# Patient Record
Sex: Female | Born: 1965 | Race: White | Hispanic: No | Marital: Married | Smoking: Never smoker
Health system: Southern US, Community
[De-identification: ages and names within clinical notes are randomized; demographics above are authoritative.]

## PROBLEM LIST (undated history)

## (undated) DIAGNOSIS — Z9221 Personal history of antineoplastic chemotherapy: Secondary | ICD-10-CM

## (undated) DIAGNOSIS — IMO0002 Reserved for concepts with insufficient information to code with codable children: Secondary | ICD-10-CM

## (undated) DIAGNOSIS — F419 Anxiety disorder, unspecified: Secondary | ICD-10-CM

## (undated) DIAGNOSIS — T8859XA Other complications of anesthesia, initial encounter: Secondary | ICD-10-CM

## (undated) DIAGNOSIS — M1990 Unspecified osteoarthritis, unspecified site: Secondary | ICD-10-CM

## (undated) DIAGNOSIS — R112 Nausea with vomiting, unspecified: Secondary | ICD-10-CM

## (undated) DIAGNOSIS — Z9889 Other specified postprocedural states: Secondary | ICD-10-CM

## (undated) DIAGNOSIS — R0683 Snoring: Secondary | ICD-10-CM

## (undated) DIAGNOSIS — K219 Gastro-esophageal reflux disease without esophagitis: Secondary | ICD-10-CM

## (undated) DIAGNOSIS — I10 Essential (primary) hypertension: Secondary | ICD-10-CM

## (undated) HISTORY — PX: LAPAROSCOPY: SHX197

## (undated) HISTORY — PX: NASAL SINUS SURGERY: SHX719

## (undated) HISTORY — PX: ABDOMINAL HYSTERECTOMY: SHX81

## (undated) HISTORY — DX: Unspecified osteoarthritis, unspecified site: M19.90

## (undated) HISTORY — DX: Gastro-esophageal reflux disease without esophagitis: K21.9

## (undated) HISTORY — PX: BLADDER SURGERY: SHX569

## (undated) HISTORY — DX: Anxiety disorder, unspecified: F41.9

---

## 1987-12-11 HISTORY — PX: DILATION AND CURETTAGE OF UTERUS: SHX78

## 2006-10-17 ENCOUNTER — Ambulatory Visit: Payer: Self-pay

## 2007-12-11 HISTORY — PX: TOTAL ABDOMINAL HYSTERECTOMY: SHX209

## 2007-12-11 HISTORY — PX: ABDOMINAL HYSTERECTOMY: SHX81

## 2007-12-11 HISTORY — PX: BLADDER SURGERY: SHX569

## 2008-02-19 ENCOUNTER — Ambulatory Visit: Payer: Self-pay | Admitting: Obstetrics and Gynecology

## 2008-03-08 ENCOUNTER — Inpatient Hospital Stay: Payer: Self-pay | Admitting: Obstetrics and Gynecology

## 2009-07-21 ENCOUNTER — Ambulatory Visit: Payer: Self-pay

## 2010-11-17 ENCOUNTER — Ambulatory Visit: Payer: Self-pay | Admitting: Internal Medicine

## 2011-11-09 ENCOUNTER — Ambulatory Visit: Payer: Self-pay

## 2011-11-20 ENCOUNTER — Ambulatory Visit: Payer: Self-pay

## 2012-06-03 ENCOUNTER — Ambulatory Visit: Payer: Self-pay | Admitting: General Practice

## 2012-08-19 ENCOUNTER — Emergency Department: Payer: Self-pay | Admitting: Unknown Physician Specialty

## 2013-01-22 ENCOUNTER — Emergency Department: Payer: Self-pay | Admitting: Emergency Medicine

## 2013-01-22 LAB — CBC
MCV: 88 fL (ref 80–100)
Platelet: 334 10*3/uL (ref 150–440)
RBC: 4.58 10*6/uL (ref 3.80–5.20)
RDW: 13.7 % (ref 11.5–14.5)
WBC: 11.3 10*3/uL — ABNORMAL HIGH (ref 3.6–11.0)

## 2013-08-26 LAB — HM PAP SMEAR: HM Pap smear: NEGATIVE

## 2013-11-27 ENCOUNTER — Ambulatory Visit: Payer: Self-pay | Admitting: Obstetrics and Gynecology

## 2015-06-02 ENCOUNTER — Ambulatory Visit
Admission: EM | Admit: 2015-06-02 | Discharge: 2015-06-02 | Disposition: A | Discharge status: Home / Self Care | Payer: No Typology Code available for payment source | Attending: Internal Medicine | Admitting: Internal Medicine

## 2015-06-02 ENCOUNTER — Ambulatory Visit: Payer: No Typology Code available for payment source

## 2015-06-02 DIAGNOSIS — R05 Cough: Secondary | ICD-10-CM | POA: Diagnosis present

## 2015-06-02 DIAGNOSIS — J209 Acute bronchitis, unspecified: Secondary | ICD-10-CM | POA: Insufficient documentation

## 2015-06-02 DIAGNOSIS — J0100 Acute maxillary sinusitis, unspecified: Secondary | ICD-10-CM | POA: Insufficient documentation

## 2015-06-02 DIAGNOSIS — H6593 Unspecified nonsuppurative otitis media, bilateral: Secondary | ICD-10-CM | POA: Insufficient documentation

## 2015-06-02 DIAGNOSIS — H6121 Impacted cerumen, right ear: Secondary | ICD-10-CM | POA: Diagnosis not present

## 2015-06-02 HISTORY — DX: Essential (primary) hypertension: I10

## 2015-06-02 HISTORY — DX: Reserved for concepts with insufficient information to code with codable children: IMO0002

## 2015-06-02 MED ORDER — FLUTICASONE PROPIONATE 50 MCG/ACT NA SUSP: 1.0000 | Freq: Two times a day (BID) | NASAL | Status: DC

## 2015-06-02 MED ORDER — SALINE SPRAY 0.65 % NA SOLN: 2.0000 | NASAL | Status: DC

## 2015-06-02 MED ORDER — OXYMETAZOLINE HCL 0.05 % NA SOLN: 1.0000 | Freq: Two times a day (BID) | NASAL | Status: DC

## 2015-06-02 MED ORDER — IPRATROPIUM-ALBUTEROL 0.5-2.5 (3) MG/3ML IN SOLN
3.0000 mL | Freq: Once | RESPIRATORY_TRACT | Status: AC
  Administered 2015-06-02: 3 mL via RESPIRATORY_TRACT

## 2015-06-02 MED ORDER — BENZONATATE 200 MG PO CAPS: 200.0000 mg | ORAL_CAPSULE | Freq: Three times a day (TID) | ORAL | Status: DC | PRN

## 2015-06-02 MED ORDER — AMOXICILLIN-POT CLAVULANATE 875-125 MG PO TABS: 1.0000 | ORAL_TABLET | Freq: Two times a day (BID) | ORAL | Status: DC

## 2015-06-02 MED ORDER — ALBUTEROL SULFATE HFA 108 (90 BASE) MCG/ACT IN AERS: 1.0000 | INHALATION_SPRAY | RESPIRATORY_TRACT | Status: DC | PRN

## 2015-06-02 NOTE — ED Provider Notes (Signed)
CSN: [id number]     Arrival date & time 06/02/15  0800 History   First MD Initiated Contact with Patient 06/02/15 0848     Chief Complaint  Patient presents with  . Cough   (Consider location/radiation/quality/duration/timing/severity/associated sxs/prior Treatment) HPI Comments: Caucasian female with worsening cough, pain with coughing/deep inspiration (chest), right ear pain, right sided pain/facial/sinus pressure.  Had [facility] prescribed azithromycin and prednisone but hand swelling and increased blood pressure/headache so stopped yesterday as BP 140/98 when nurse at work took it for her on  po taper; patient requesting tessalon pearles for cough and another antibiotic.  If able to cough anything up thick green.  Night sweats/chills.  Leaving tomorrow for vacation at the beach.  Normal blood pressure per patient 120/80.  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 female presenting with cough. The history is provided by the patient.  Cough Cough characteristics:  Productive, non-productive and hacking Sputum characteristics:  Green Severity:  Moderate Onset quality:  Gradual Duration:  6 days Timing:  Intermittent Progression:  Worsening Chronicity:  New Smoker: no   Context: upper respiratory infection and weather changes   Context: not animal exposure, not exposure to allergens, not occupational exposure and not sick contacts   Relieved by:  Nothing Worsened by:  Deep breathing Ineffective treatments:  Rest and fluids Associated symptoms: chest pain, chills, diaphoresis, ear fullness, ear pain, headaches, rhinorrhea and sinus congestion   Associated symptoms: no eye discharge, no fever, no myalgias, no rash, no shortness of breath, no sore throat, no weight loss and no wheezing   Chest pain:    Quality:  Aching   Severity:  Mild   Duration:  6 days   Timing:   Intermittent   Progression:  Unchanged   Chronicity:  New Ear pain:    Location:  Right   Severity:  Mild   Onset quality:  Sudden   Duration:  6 days   Timing:  Intermittent   Progression:  Unchanged   Chronicity:  New Headaches:    Severity:  Severe   Onset quality:  Sudden   Duration:  2 days   Timing:  Constant   Progression:  Worsening   Chronicity:  Recurrent Rhinorrhea:    Quality:  White   Severity:  Moderate   Duration:  2 days   Timing:  Intermittent   Progression:  Worsening Risk factors: recent infection   Risk factors: no chemical exposure and no recent travel     Past Medical History  Diagnosis Date  . Choriocarcinoma   . Hypertension    Past Surgical History  Procedure Laterality Date  . Nasal sinus surgery     Family History  Problem Relation Age of Onset  . Diabetes Mother   . Hypertension Mother   . Hypertension Father    History  Substance Use Topics  . Smoking status: Never Smoker   . Smokeless tobacco: Not on file  . Alcohol Use: No   OB History    No data available     Review of Systems  Constitutional: Positive for chills and diaphoresis. Negative for fever, weight loss, activity change, appetite change and fatigue.  HENT: Positive for congestion, ear pain, rhinorrhea and sinus pressure. Negative for dental problem, drooling, ear discharge, facial swelling, hearing loss, mouth sores, nosebleeds, postnasal drip, sneezing, sore throat, tinnitus, trouble swallowing and voice change.  Eyes: Negative for photophobia, pain, discharge, redness, itching and visual disturbance.  Respiratory: Positive for cough. Negative for choking, shortness of breath, wheezing and stridor.   Cardiovascular: Positive for chest pain. Negative for palpitations and leg swelling.  Gastrointestinal: Negative for nausea, vomiting, abdominal pain, diarrhea, constipation, blood in stool, abdominal distention, anal bleeding and rectal pain.  Endocrine: Negative for  cold intolerance and heat intolerance.  Genitourinary: Negative for hematuria, enuresis and difficulty urinating.  Musculoskeletal: Negative for myalgias, back pain, joint swelling, arthralgias, gait problem, neck pain and neck stiffness.  Skin: Negative for color change, pallor, rash and wound.  Allergic/Immunologic: Positive for environmental allergies. Negative for food allergies.  Neurological: Positive for headaches. Negative for dizziness, tremors, seizures, syncope, facial asymmetry, speech difficulty, weakness, light-headedness and numbness.  Hematological: Negative for adenopathy. Does not bruise/bleed easily.  Psychiatric/Behavioral: Negative for behavioral problems, confusion, sleep disturbance and agitation.    Allergies  Codeine sulfate and Erythromycin  Home Medications   Prior to Admission medications   Medication Sig Start Date End Date Taking? Authorizing Provider  cetirizine (ZYRTEC) 10 MG tablet Take 10 mg by mouth daily.   Yes Historical Provider, MD  albuterol (PROVENTIL HFA;VENTOLIN HFA) 108 (90 BASE) MCG/ACT inhaler Inhale 1-2 puffs into the lungs every 4 (four) hours as needed for wheezing or shortness of breath. 06/02/15   Barbaraann Barthel, NP  amoxicillin-clavulanate (AUGMENTIN) 875-125 MG per tablet Take 1 tablet by mouth every 12 (twelve) hours. 06/02/15   Barbaraann Barthel, NP  benzonatate (TESSALON) 200 MG capsule Take 1 capsule (200 mg total) by mouth 3 (three) times daily as needed for cough. 06/02/15   Barbaraann Barthel, NP  fluticasone (FLONASE) 50 MCG/ACT nasal spray Place 1 spray into both nostrils 2 (two) times daily. 06/02/15   Barbaraann Barthel, NP  oxymetazoline (AFRIN NASAL SPRAY) 0.05 % nasal spray Place 1 spray into both nostrils 2 (two) times daily. 3 days maximum use 06/02/15   Barbaraann Barthel, NP  sodium chloride (OCEAN) 0.65 % SOLN nasal spray Place 2 sprays into both nostrils every 2 (two) hours while awake. 06/02/15   Jarold Song Kyiah Canepa, NP   BP  136/83 mmHg  Pulse 88  Temp(Src) 98 F (36.7 C) (Oral)  Resp 16  Ht 5\' 8"  (1.727 m)  Wt 180 lb (81.647 kg)  BMI 27.38 kg/m2  SpO2 100% Physical Exam  Constitutional: She is oriented to person, place, and time. Vital signs are normal. She appears well-developed and well-nourished. No distress.  HENT:  Head: Normocephalic and atraumatic.  Right Ear: External ear normal.  Left Ear: External ear normal.  Nose: Mucosal edema and rhinorrhea present. No nose lacerations, sinus tenderness, nasal deformity, septal deviation or nasal septal hematoma. No epistaxis.  No foreign bodies. Right sinus exhibits maxillary sinus tenderness. Right sinus exhibits no frontal sinus tenderness. Left sinus exhibits maxillary sinus tenderness. Left sinus exhibits no frontal sinus tenderness.  Mouth/Throat: Uvula is midline and mucous membranes are normal. She does not have dentures. No oral lesions. No trismus in the jaw. Normal dentition. No dental abscesses, uvula swelling, lacerations or dental caries. Posterior oropharyngeal edema and posterior oropharyngeal erythema present. No oropharyngeal exudate or tonsillar abscesses.  Right TM occluded by cerumen adjacent to TM brown; left TM with air fluid level slight opacity; bilateral TMs with edema/erythema clear/white discharge  Eyes: Conjunctivae, EOM and lids are normal. Pupils are equal, round, and reactive to light. Right eye exhibits no discharge. No scleral icterus.  Neck: Trachea  normal and normal range of motion. Neck supple. No tracheal deviation present. No thyromegaly present.  Cardiovascular: Normal rate, regular rhythm, normal heart sounds and intact distal pulses.  Exam reveals no gallop and no friction rub.   No murmur heard. Pulmonary/Chest: Effort normal and breath sounds normal. No accessory muscle usage or stridor. No tachypnea and no bradypnea. No respiratory distress. She has no decreased breath sounds. She has no wheezes. She has no rhonchi. She  has no rales. She exhibits no tenderness.  Abdominal: Soft. Bowel sounds are normal. She exhibits no distension and no mass. There is no tenderness. There is no rebound and no guarding.  Musculoskeletal: Normal range of motion. She exhibits edema. She exhibits no tenderness.  Lymphadenopathy:    She has no cervical adenopathy.  Neurological: She is alert and oriented to person, place, and time. No cranial nerve deficit. Coordination normal.  Skin: Skin is warm, dry and intact. No rash noted. She is not diaphoretic. No erythema. No pallor.  Psychiatric: She has a normal mood and affect. Her speech is normal and behavior is normal. Judgment and thought content normal. Cognition and memory are normal.  Nursing note and vitals reviewed.   ED Course  Procedures (including critical care time) Labs Review Labs Reviewed - No data to display  Imaging Review Dg Chest 2 View  06/02/2015   CLINICAL DATA:  Six day history of cough. Shortness of breath with exertion. Chest tightness.  EXAM: CHEST  2 VIEW  COMPARISON:  None.  FINDINGS: Lungs are clear. Heart size and pulmonary vascularity are normal. No pneumothorax. No adenopathy. No bone lesions.  IMPRESSION: No edema or consolidation.   Electronically Signed   By: Bretta Bang III M.D.   On: 06/02/2015 09:15   Rechecked right TM after ear irrigation cerumen completely cleared/removed.  Air fluid level slight opacity TM intact right TM.  Bilateral BS improved airflow.  Patient reported she feels less chest congestion.  Rx for albuterol MDI 2 puffs po q4-6h prn wheezing/protracted coughing.  Discussed may continue mucinex in am but did not recommend pm use as will increase cough during hours of sleep.  Tessalon pearles  po TID prn cough or robitussin/delsym/nyquil OTC (choose one do not take all at same time)  Patient verbalized understanding of information/instructions, agreed with plan of care and had no further questions at this time.  MDM    1. Otitis media with effusion, bilateral   2. Cerumen impaction, right   3. Acute bronchitis, unspecified organism   4. Acute maxillary sinusitis, recurrence not specified   Supportive treatment.   No evidence of invasive bacterial infection, non toxic and well hydrated.  This is most likely self limiting viral infection.  I do not see where any further testing or imaging is necessary at this time.   I will suggest supportive care, rest, good hygiene and encourage the patient to take adequate fluids.  The patient is to return to clinic or EMERGENCY ROOM if symptoms worsen or change significantly e.g. ear pain, fever, purulent discharge from ears or bleeding.  Exitcare handout on otitis media with effusion given to patient.  Patient verbalized agreement and understanding of treatment plan.    Cerumen irrigation performed and complete clearing of earwax obtained by nurse.  Patient reported slight discomfort external ear canal after procedure, minor redness noted external canal.  Right TM with air fluid level slight opacity.  bilaterally TMs intact without erythema.  Patient reported sounds are louder now.  Discussed purpose of earwax with patient.  Avoid cotton applicator (Q-tip) use in ears.  Patient verbalized understanding, agreed with plan of care and had no further questions at this time.    Bronchitis simple, community acquired, may have started as viral (probably respiratory syncytial, parainfluenza, influenza, or adenovirus), but now evidence of acute purulent bronchitis with resultant bronchial edema and mucus formation.  Viruses are the most common cause of bronchial inflammation in otherwise healthy adults with acute bronchitis.  The appearance of sputum is not predictive of whether a bacterial infection is present.  Purulent sputum is most often caused by viral infections.  There are a small portion of those caused by non-viral agents being Mycoplamsa pneumonia.  Microscopic examination or C&S  of sputum in the healthy adult with acute bronchitis is generally not helpful (usually negative or normal respiratory flora) other considerations being cough from upper respiratory tract infections, sinusitis or allergic syndromes (mild asthma or viral pneumonia).  Differential Diagnosis:  reactive airway disease (asthma, allergic aspergillosis (eosinophilia), chronic bronchitis, respiratory infection (Sinusitis, Common cold, pneumonia), congestive heart failure, reflux esophagitis, bronchogenic tumor, aspiration syndromes and/or exposure irritants/tobacco smoke.  In this case, there is no evidence of any invasive bacterial illness.  Most likely viral etiology so will hold on antibiotic treatment.  Advise supportive care with rest, encourage fluids, good hygiene and watch for any worsening symptoms.  If they were to develop:  come back to the office or go to the emergency room if after hours. Without high fever, severe dyspnea, lack of physical findings or other risk factors, I will hold on a chest radiograph and CBC at this time. I discussed that approximately 50% of patients with acute bronchitis have a cough that lasts up to three weeks, and 25% for over a month.  Tylenol, one to two tablets every four hours as needed for fever or myalgias.   No aspirin.  Patient instructed to follow up in one week or sooner if symptoms worsen. Patient verbalized agreement and understanding of treatment plan.  P2:  hand washing and cover cough  No evidence of systemic bacterial infection, non toxic and well hydrated.  Will trial flonase or nasocort 1 spray each nostril BID as did not tolerate po prednisone earlier this month and add afrin 1 spray BID each nostril x 3 days and nasal saline 2 sprays each nostril q2h while awake/prn congestion.  Patient given paper augmentin Rx in case worsening sinusitis during vacation to [facility] beach area e.g. Fever greater than 100.47F, purulent nasal discharge.  I do not see where any further  testing or imaging is necessary at this time.   I will suggest supportive care, rest, good hygiene and encourage the patient to take adequate fluids.  The patient is to return to clinic or EMERGENCY ROOM if symptoms worsen or change significantly.  Exitcare handout on sinusitis given to patient.  Patient verbalized agreement and understanding of treatment plan and had no further questions at this time.   P2:  Hand washing and cover cough   Barbaraann Barthel, NP 06/02/15 1446

## 2015-06-02 NOTE — Discharge Instructions (Signed)
Acute Bronchitis Bronchitis is inflammation of the airways that extend from the windpipe into the lungs (bronchi). The inflammation often causes mucus to develop. This leads to a cough, which is the most common symptom of bronchitis.  In acute bronchitis, the condition usually develops suddenly and goes away over time, usually in a couple weeks. Smoking, allergies, and asthma can make bronchitis worse. Repeated episodes of bronchitis may cause further lung problems.  CAUSES Acute bronchitis is most often caused by the same virus that causes a cold. The virus can spread from person to person (contagious) through coughing, sneezing, and touching contaminated objects. SIGNS AND SYMPTOMS   Cough.   Fever.   Coughing up mucus.   Body aches.   Chest congestion.   Chills.   Shortness of breath.   Sore throat.  DIAGNOSIS  Acute bronchitis is usually diagnosed through a physical exam. Your health care provider will also ask you questions about your medical history. Tests, such as chest X-rays, are sometimes done to rule out other conditions.  TREATMENT  Acute bronchitis usually goes away in a couple weeks. Oftentimes, no medical treatment is necessary. Medicines are sometimes given for relief of fever or cough. Antibiotic medicines are usually not needed but may be prescribed in certain situations. In some cases, an inhaler may be recommended to help reduce shortness of breath and control the cough. A cool mist vaporizer may also be used to help thin bronchial secretions and make it easier to clear the chest.  HOME CARE INSTRUCTIONS  Get plenty of rest.   Drink enough fluids to keep your urine clear or pale yellow (unless you have a medical condition that requires fluid restriction). Increasing fluids may help thin your respiratory secretions (sputum) and reduce chest congestion, and it will prevent dehydration.   Take medicines only as directed by your health care provider.  If  you were prescribed an antibiotic medicine, finish it all even if you start to feel better.  Avoid smoking and secondhand smoke. Exposure to cigarette smoke or irritating chemicals will make bronchitis worse. If you are a smoker, consider using nicotine gum or skin patches to help control withdrawal symptoms. Quitting smoking will help your lungs heal faster.   Reduce the chances of another bout of acute bronchitis by washing your hands frequently, avoiding people with cold symptoms, and trying not to touch your hands to your mouth, nose, or eyes.   Keep all follow-up visits as directed by your health care provider.  SEEK MEDICAL CARE IF: Your symptoms do not improve after 1 week of treatment.  SEEK IMMEDIATE MEDICAL CARE IF:  You develop an increased fever or chills.   You have chest pain.   You have severe shortness of breath.  You have bloody sputum.   You develop dehydration.  You faint or repeatedly feel like you are going to pass out.  You develop repeated vomiting.  You develop a severe headache. MAKE SURE YOU:   Understand these instructions.  Will watch your condition.  Will get help right away if you are not doing well or get worse. Document Released: 01/03/2005 Document Revised: 04/12/2014 Document Reviewed: 05/19/2013 [facility] Patient Information 2015 [place], [place]. This information is not intended to replace advice given to you by your health care provider. Make sure you discuss any questions you have with your health care provider.  Cerumen Impaction A cerumen impaction is when the wax in your ear forms a plug. This plug usually causes reduced hearing. Sometimes it  also causes an earache or dizziness. Removing a cerumen impaction can be difficult and painful. The wax sticks to the ear canal. The canal is sensitive and bleeds easily. If you try to remove a heavy wax buildup with a cotton tipped swab, you may push it in further. Irrigation with water,  suction, and small ear curettes may be used to clear out the wax. If the impaction is fixed to the skin in the ear canal, ear drops may be needed for a few days to loosen the wax. People who build up a lot of wax frequently can use ear wax removal products available in your local drugstore. SEEK MEDICAL CARE IF:  You develop an earache, increased hearing loss, or marked dizziness. Document Released: 01/03/2005 Document Revised: 02/18/2012 Document Reviewed: 02/23/2010 [facility] Patient Information 2015 [place], [place]. This information is not intended to replace advice given to you by your health care provider. Make sure you discuss any questions you have with your health care provider. Sinusitis Sinusitis is redness, soreness, and inflammation of the paranasal sinuses. Paranasal sinuses are air pockets within the bones of your face (beneath the eyes, the middle of the forehead, or above the eyes). In healthy paranasal sinuses, mucus is able to drain out, and air is able to circulate through them by way of your nose. However, when your paranasal sinuses are inflamed, mucus and air can become trapped. This can allow bacteria and other germs to grow and cause infection. Sinusitis can develop quickly and last only a short time (acute) or continue over a long period (chronic). Sinusitis that lasts for more than 12 weeks is considered chronic.  CAUSES  Causes of sinusitis include:  Allergies.  Structural abnormalities, such as displacement of the cartilage that separates your nostrils (deviated septum), which can decrease the air flow through your nose and sinuses and affect sinus drainage.  Functional abnormalities, such as when the small hairs (cilia) that line your sinuses and help remove mucus do not work properly or are not present. SIGNS AND SYMPTOMS  Symptoms of acute and chronic sinusitis are the same. The primary symptoms are pain and pressure around the affected sinuses. Other symptoms  include:  Upper toothache.  Earache.  Headache.  Bad breath.  Decreased sense of smell and taste.  A cough, which worsens when you are lying flat.  Fatigue.  Fever.  Thick drainage from your nose, which often is green and may contain pus (purulent).  Swelling and warmth over the affected sinuses. DIAGNOSIS  Your health care provider will perform a physical exam. During the exam, your health care provider may:  Look in your nose for signs of abnormal growths in your nostrils (nasal polyps).  Tap over the affected sinus to check for signs of infection.  View the inside of your sinuses (endoscopy) using an imaging device that has a light attached (endoscope). If your health care provider suspects that you have chronic sinusitis, one or more of the following tests may be recommended:  Allergy tests.  Nasal culture. A sample of mucus is taken from your nose, sent to a lab, and screened for bacteria.  Nasal cytology. A sample of mucus is taken from your nose and examined by your health care provider to determine if your sinusitis is related to an allergy. TREATMENT  Most cases of acute sinusitis are related to a viral infection and will resolve on their own within 10 days. Sometimes medicines are prescribed to help relieve symptoms (pain medicine, decongestants, nasal steroid  sprays, or saline sprays).  However, for sinusitis related to a bacterial infection, your health care provider will prescribe antibiotic medicines. These are medicines that will help kill the bacteria causing the infection.  Rarely, sinusitis is caused by a fungal infection. In theses cases, your health care provider will prescribe antifungal medicine. For some cases of chronic sinusitis, surgery is needed. Generally, these are cases in which sinusitis recurs more than 3 times per year, despite other treatments. HOME CARE INSTRUCTIONS   Drink plenty of water. Water helps thin the mucus so your sinuses can  drain more easily.  Use a humidifier.  Inhale steam 3 to 4 times a day (for example, sit in the bathroom with the shower running).  Apply a warm, moist washcloth to your face 3 to 4 times a day, or as directed by your health care provider.  Use saline nasal sprays to help moisten and clean your sinuses.  Take medicines only as directed by your health care provider.  If you were prescribed either an antibiotic or antifungal medicine, finish it all even if you start to feel better. SEEK IMMEDIATE MEDICAL CARE IF:  You have increasing pain or severe headaches.  You have nausea, vomiting, or drowsiness.  You have swelling around your face.  You have vision problems.  You have a stiff neck.  You have difficulty breathing. MAKE SURE YOU:   Understand these instructions.  Will watch your condition.  Will get help right away if you are not doing well or get worse. Document Released: 11/26/2005 Document Revised: 04/12/2014 Document Reviewed: 12/11/2011 [facility] Patient Information 2015 [place], [place]. This information is not intended to replace advice given to you by your health care provider. Make sure you discuss any questions you have with your health care provider. Otitis Media With Effusion Otitis media with effusion is the presence of fluid in the middle ear. This is a common problem in children, which often follows ear infections. It may be present for weeks or longer after the infection. Unlike an acute ear infection, otitis media with effusion refers only to fluid behind the ear drum and not infection. Children with repeated ear and sinus infections and allergy problems are the most likely to get otitis media with effusion. CAUSES  The most frequent cause of the fluid buildup is dysfunction of the eustachian tubes. These are the tubes that drain fluid in the ears to the back of the nose (nasopharynx). SYMPTOMS   The main symptom of this condition is hearing loss. As a  result, you or your child may:  Listen to the TV at a loud volume.  Not respond to questions.  Ask "what" often when spoken to.  Mistake or confuse one sound or word for another.  There may be a sensation of fullness or pressure but usually not pain. DIAGNOSIS   Your health care provider will diagnose this condition by examining you or your child's ears.  Your health care provider may test the pressure in you or your child's ear with a tympanometer.  A hearing test may be conducted if the problem persists. TREATMENT   Treatment depends on the duration and the effects of the effusion.  Antibiotics, decongestants, nose drops, and cortisone-type drugs (tablets or nasal spray) may not be helpful.  Children with persistent ear effusions may have delayed language or behavioral problems. Children at risk for developmental delays in hearing, learning, and speech may require referral to a specialist earlier than children not at risk.  You  or your child's health care provider may suggest a referral to an ear, nose, and throat surgeon for treatment. The following may help restore normal hearing:  Drainage of fluid.  Placement of ear tubes (tympanostomy tubes).  Removal of adenoids (adenoidectomy). HOME CARE INSTRUCTIONS   Avoid secondhand smoke.  Infants who are breastfed are less likely to have this condition.  Avoid feeding infants while they are lying flat.  Avoid known environmental allergens.  Avoid people who are sick. SEEK MEDICAL CARE IF:   Hearing is not better in 3 months.  Hearing is worse.  Ear pain.  Drainage from the ear.  Dizziness. MAKE SURE YOU:   Understand these instructions.  Will watch your condition.  Will get help right away if you are not doing well or get worse. Document Released: 01/03/2005 Document Revised: 04/12/2014 Document Reviewed: 06/23/2013 [facility] Patient Information 2015 [place], [place]. This information is not intended to  replace advice given to you by your health care provider. Make sure you discuss any questions you have with your health care provider.

## 2015-06-02 NOTE — ED Notes (Signed)
Started Saturday with cough. Saw Greig Right PA and given Z-Pack. "Hasn't helped". States causing B/P to be elevated. Also was started on Prednisone but stopped.

## 2015-06-06 ENCOUNTER — Encounter: Payer: Self-pay | Admitting: *Deleted

## 2015-06-08 ENCOUNTER — Ambulatory Visit (INDEPENDENT_AMBULATORY_CARE_PROVIDER_SITE_OTHER): Payer: PRIVATE HEALTH INSURANCE | Admitting: Obstetrics and Gynecology

## 2015-06-08 ENCOUNTER — Encounter: Payer: Self-pay | Admitting: Obstetrics and Gynecology

## 2015-06-08 VITALS — BP 143/87 | HR 91 | Ht 68.0 in | Wt 183.7 lb

## 2015-06-08 DIAGNOSIS — IMO0001 Reserved for inherently not codable concepts without codable children: Secondary | ICD-10-CM

## 2015-06-08 DIAGNOSIS — R030 Elevated blood-pressure reading, without diagnosis of hypertension: Secondary | ICD-10-CM

## 2015-06-08 DIAGNOSIS — F419 Anxiety disorder, unspecified: Secondary | ICD-10-CM | POA: Diagnosis not present

## 2015-06-08 MED ORDER — ALPRAZOLAM 0.5 MG PO TABS: 0.5000 mg | ORAL_TABLET | Freq: Three times a day (TID) | ORAL | Status: DC | PRN

## 2015-06-08 MED ORDER — MONTELUKAST SODIUM 10 MG PO TABS: 10.0000 mg | ORAL_TABLET | Freq: Every day | ORAL | Status: DC

## 2015-06-08 MED ORDER — HYDROCHLOROTHIAZIDE 25 MG PO TABS: 25.0000 mg | ORAL_TABLET | Freq: Every day | ORAL | Status: DC

## 2015-06-08 NOTE — Progress Notes (Signed)
Patient ID: JAMIE HAFFORD, female   DOB: 02-Oct-1966, 49 y.o.   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  She has the following symptoms: fatigue, feelings of losing control, irritable, racing thoughts. Onset of symptoms was approximately 4 weeks ago, gradually worsening since that time. She denies current suicidal and homicidal ideation. Family history significant for depression.Possible organic causes contributing are: none. Risk factors: previous episode of depression Previous treatment includes Selective Seratonin Reuptake Inhibitor NA and none.  She complains of the following side effects from the treatment: none. Subjective:  MALEIGH BAGOT is a 49 y.o. female with hypertension. Current Outpatient Prescriptions  Medication Sig Dispense Refill  . albuterol (PROVENTIL HFA;VENTOLIN HFA) 108 (90 BASE) MCG/ACT inhaler Inhale 1-2 puffs into the lungs every 4 (four) hours as needed for wheezing or shortness of breath. 1 Inhaler 0  . benzonatate (TESSALON) 200 MG capsule Take 1 capsule (200 mg total) by mouth 3 (three) times daily as needed for cough. 30 capsule 0  . cetirizine (ZYRTEC) 10 MG tablet Take 10 mg by mouth daily.    . fluticasone (FLONASE) 50 MCG/ACT nasal spray Place 1 spray into both nostrils 2 (two) times daily. 16 g 2  . oxymetazoline (AFRIN NASAL SPRAY) 0.05 % nasal spray Place 1 spray into both nostrils 2 (two) times daily. 3 days maximum use 30 mL 0  . sodium chloride (OCEAN) 0.65 % SOLN nasal spray Place 2 sprays into both nostrils every 2 (two) hours while awake.  0  . ALPRAZolam (XANAX) 0.5 MG tablet Take 1 tablet (0.5 mg total) by mouth 3 (three) times daily as needed for anxiety. 40 tablet 1  .  amoxicillin-clavulanate (AUGMENTIN) 875-125 MG per tablet Take 1 tablet by mouth every 12 (twelve) hours. (Patient not taking: Reported on 06/08/2015) 20 tablet 0  . hydrochlorothiazide (HYDRODIURIL) 25 MG tablet Take 1 tablet (25 mg total) by mouth daily. 30 tablet 5  . montelukast (SINGULAIR) 10 MG tablet Take 1 tablet (10 mg total) by mouth at bedtime. 30 tablet 2   No current facility-administered medications for this visit.    Hypertension ROS: taking medications as instructed, no medication side effects noted, home BP monitoring in range of 140-150's systolic over 85-99's diastolic, no TIA's, no chest pain on exertion, no dyspnea on exertion and noting swelling of ankles.  New concerns: HA and dizziness.   Objective:  BP 143/87 mmHg  Pulse 91  Ht  (1.727 m)  Wt 183 lb 11.2 oz (83.326 kg)  BMI 27.94 kg/m2  Appearance alert, well appearing, and in no distress, oriented to person, place, and time and overweight. General exam BP noted to be mildly elevated today in office, S1, S2 normal, no gallop, no murmur, chest clear, no JVD, no HSM, no edema.  Lab review: orders written for new lab studies as appropriate; see orders.   Assessment:   Hypertension needs further observation and labs obtained and HCTZ 25 mg rx sent in to start in 1 week.   Plan:  Reviewed diet, exercise and weight control. Recommended sodium restriction. Reviewed medications and side effects in detail. Reviewed potential future medication changes and side effects. The following changes are to be made: stop zyrtec and change to singulair  Follow up: 2 weeks and as needed..Marland Kitchen

## 2015-06-08 NOTE — Patient Instructions (Signed)
Hypertension Hypertension, commonly called high blood pressure, is when the force of blood pumping through your arteries is too strong. Your arteries are the blood vessels that carry blood from your heart throughout your body. A blood pressure reading consists of a higher number over a lower number, such as 110/72. The higher number (systolic) is the pressure inside your arteries when your heart pumps. The lower number (diastolic) is the pressure inside your arteries when your heart relaxes. Ideally you want your blood pressure below 120/80. Hypertension forces your heart to work harder to pump blood. Your arteries may become narrow or stiff. Having hypertension puts you at risk for heart disease, stroke, and other problems.  RISK FACTORS Some risk factors for high blood pressure are controllable. Others are not.  Risk factors you cannot control include:   Race. You may be at higher risk if you are African American.  Age. Risk increases with age.  Gender. Men are at higher risk than women before age 45 years. After age 65, women are at higher risk than men. Risk factors you can control include:  Not getting enough exercise or physical activity.  Being overweight.  Getting too much fat, sugar, calories, or salt in your diet.  Drinking too much alcohol. SIGNS AND SYMPTOMS Hypertension does not usually cause signs or symptoms. Extremely high blood pressure (hypertensive crisis) may cause headache, anxiety, shortness of breath, and nosebleed. DIAGNOSIS  To check if you have hypertension, your health care provider will measure your blood pressure while you are seated, with your arm held at the level of your heart. It should be measured at least twice using the same arm. Certain conditions can cause a difference in blood pressure between your right and left arms. A blood pressure reading that is higher than normal on one occasion does not mean that you need treatment. If one blood pressure reading  is high, ask your health care provider about having it checked again. TREATMENT  Treating high blood pressure includes making lifestyle changes and possibly taking medicine. Living a healthy lifestyle can help lower high blood pressure. You may need to change some of your habits. Lifestyle changes may include:  Following the DASH diet. This diet is high in fruits, vegetables, and whole grains. It is low in salt, red meat, and added sugars.  Getting at least 2 hours of brisk physical activity every week.  Losing weight if necessary.  Not smoking.  Limiting alcoholic beverages.  Learning ways to reduce stress. If lifestyle changes are not enough to get your blood pressure under control, your health care provider may prescribe medicine. You may need to take more than one. Work closely with your health care provider to understand the risks and benefits. HOME CARE INSTRUCTIONS  Have your blood pressure rechecked as directed by your health care provider.   Take medicines only as directed by your health care provider. Follow the directions carefully. Blood pressure medicines must be taken as prescribed. The medicine does not work as well when you skip doses. Skipping doses also puts you at risk for problems.   Do not smoke.   Monitor your blood pressure at home as directed by your health care provider. SEEK MEDICAL CARE IF:   You think you are having a reaction to medicines taken.  You have recurrent headaches or feel dizzy.  You have swelling in your ankles.  You have trouble with your vision. SEEK IMMEDIATE MEDICAL CARE IF:  You develop a severe headache or confusion.    You have unusual weakness, numbness, or feel faint.  You have severe chest or abdominal pain.  You vomit repeatedly.  You have trouble breathing. MAKE SURE YOU:   Understand these instructions.  Will watch your condition.  Will get help right away if you are not doing well or get worse. Document  Released: 11/26/2005 Document Revised: 04/12/2014 Document Reviewed: 09/18/2013 [employer] Patient Information 2015 [employer]. This information is not intended to replace advice given to you by your health care provider. Make sure you discuss any questions you have with your health care provider. Generalized Anxiety Disorder Generalized anxiety disorder (GAD) is a mental disorder. It interferes with life functions, including relationships, work, and school. GAD is different from normal anxiety, which everyone experiences at some point in their lives in response to specific life events and activities. Normal anxiety actually helps us prepare for and get through these life events and activities. Normal anxiety goes away after the event or activity is over.  GAD causes anxiety that is not necessarily related to specific events or activities. It also causes excess anxiety in proportion to specific events or activities. The anxiety associated with GAD is also difficult to control. GAD can vary from mild to severe. People with severe GAD can have intense waves of anxiety with physical symptoms (panic attacks).  SYMPTOMS The anxiety and worry associated with GAD are difficult to control. This anxiety and worry are related to many life events and activities and also occur more days than not for 6 months or longer. People with GAD also have three or more of the following symptoms (one or more in children): Restlessness.  Fatigue. Difficulty concentrating.  Irritability. Muscle tension. Difficulty sleeping or unsatisfying sleep. DIAGNOSIS GAD is diagnosed through an assessment by your health care provider. Your health care provider will ask you questions aboutyour mood,physical symptoms, and events in your life. Your health care provider may ask you about your medical history and use of alcohol or drugs, including prescription medicines. Your health care provider may also do a physical exam and blood  tests. Certain medical conditions and the use of certain substances can cause symptoms similar to those associated with GAD. Your health care provider may refer you to a mental health specialist for further evaluation. TREATMENT The following therapies are usually used to treat GAD:  Medication. Antidepressant medication usually is prescribed for long-term daily control. Antianxiety medicines may be added in severe cases, especially when panic attacks occur.  Talk therapy (psychotherapy). Certain types of talk therapy can be helpful in treating GAD by providing support, education, and guidance. A form of talk therapy called cognitive behavioral therapy can teach you healthy ways to think about and react to daily life events and activities. Stress managementtechniques. These include yoga, meditation, and exercise and can be very helpful when they are practiced regularly. A mental health specialist can help determine which treatment is best for you. Some people see improvement with one therapy. However, other people require a combination of therapies. Document Released: 03/23/2013 Document Revised: 04/12/2014 Document Reviewed: 03/23/2013 [employer] Patient Information 2015 [employer]. This information is not intended to replace advice given to you by your health care provider. Make sure you discuss any questions you have with your health care provider.  

## 2015-06-09 LAB — COMPREHENSIVE METABOLIC PANEL
ALK PHOS: 86 IU/L (ref 39–117)
ALT: 22 IU/L (ref 0–32)
AST: 18 IU/L (ref 0–40)
Albumin/Globulin Ratio: 1.4 (ref 1.1–2.5)
Albumin: 4.4 g/dL (ref 3.5–5.5)
BUN/Creatinine Ratio: 19 (ref 9–23)
BUN: 11 mg/dL (ref 6–24)
Bilirubin Total: <0.2 mg/dL (ref 0.0–1.2)
CHLORIDE: 97 mmol/L (ref 97–108)
CO2: 26 mmol/L (ref 18–29)
Calcium: 9.2 mg/dL (ref 8.7–10.2)
Creatinine, Ser: 0.57 mg/dL (ref 0.57–1.00)
GFR calc Af Amer: 127 mL/min/{1.73_m2} (ref >59)
GFR calc non Af Amer: 110 mL/min/{1.73_m2} (ref >59)
GLOBULIN, TOTAL: 3.1 g/dL (ref 1.5–4.5)
Glucose: 83 mg/dL (ref 65–99)
Potassium: 3.7 mmol/L (ref 3.5–5.2)
SODIUM: 136 mmol/L (ref 134–144)
TOTAL PROTEIN: 7.5 g/dL (ref 6.0–8.5)

## 2015-06-09 LAB — TSH: TSH: 1.1 u[IU]/mL (ref 0.450–4.500)

## 2015-06-09 LAB — T3, FREE: T3, Free: 3.4 pg/mL (ref 2.0–4.4)

## 2015-06-09 LAB — T4, FREE: FREE T4: 1.08 ng/dL (ref 0.82–1.77)

## 2015-06-30 ENCOUNTER — Ambulatory Visit: Payer: PRIVATE HEALTH INSURANCE | Admitting: Obstetrics and Gynecology

## 2015-07-20 ENCOUNTER — Encounter: Payer: PRIVATE HEALTH INSURANCE | Admitting: Obstetrics and Gynecology

## 2015-08-11 ENCOUNTER — Encounter: Payer: PRIVATE HEALTH INSURANCE | Admitting: Obstetrics and Gynecology

## 2015-08-17 ENCOUNTER — Ambulatory Visit (INDEPENDENT_AMBULATORY_CARE_PROVIDER_SITE_OTHER): Payer: PRIVATE HEALTH INSURANCE | Admitting: Obstetrics and Gynecology

## 2015-08-17 ENCOUNTER — Other Ambulatory Visit: Payer: Self-pay | Admitting: Obstetrics and Gynecology

## 2015-08-17 ENCOUNTER — Encounter: Payer: Self-pay | Admitting: Obstetrics and Gynecology

## 2015-08-17 VITALS — BP 117/76 | HR 78 | Ht 68.0 in | Wt 182.7 lb

## 2015-08-17 DIAGNOSIS — Z01419 Encounter for gynecological examination (general) (routine) without abnormal findings: Secondary | ICD-10-CM | POA: Diagnosis not present

## 2015-08-17 MED ORDER — PROGESTERONE MICRONIZED 200 MG PO CAPS: 200.0000 mg | ORAL_CAPSULE | Freq: Every day | ORAL | Status: DC

## 2015-08-17 NOTE — Patient Instructions (Addendum)
Thank you for enrolling in MyChart. Please follow the instructions below to securely access your online medical record. MyChart allows you to send messages to your doctor, view your test results, renew your prescriptions, schedule appointments, and more.  How Do I Sign Up? 1. In your Internet browser, go to [email or web address] 2. Click on the New  User? link in the Sign In box.  3. Enter your MyChart Access Code exactly as it appears below. You will not need to use this code after you have completed the sign-up process. If you do not sign up before the expiration date, you must request a new code. MyChart Access Code: [email or web address] Expires: 10/01/2015  3:07 AM  4. Enter the last four digits of your Social Security Number (xxxx) and Date of Birth (mm/dd/yyyy) as indicated and click Next. You will be taken to the next sign-up page. 5. Create a MyChart ID. This will be your MyChart login ID and cannot be changed, so think of one that is secure and easy to remember. 6. Create a MyChart password. You can change your password at any time. 7. Enter your Password Reset Question and Answer and click Next. This can be used at a later time if you forget your password.  8. Select your communication preference, and if applicable enter your e-mail address. You will receive e-mail notification when new information is available in MyChart by choosing to receive e-mail notifications and filling in your e-mail. 9. Click Sign In. You can now view your medical record.   Additional Information If you have questions, you can email [email or web address] or call [phone] to talk to our MyChart staff. Remember, MyChart is NOT to be used for urgent needs. For medical emergencies, dial 911. Kegel Exercises The goal of Kegel exercises is to isolate and exercise your pelvic floor muscles. These muscles act as a hammock that supports the rectum, vagina, small intestine, and uterus. As the  muscles weaken, the hammock sags and these organs are displaced from their normal positions. Kegel exercises can strengthen your pelvic floor muscles and help you to improve bladder and bowel control, improve sexual response, and help reduce many problems and some discomfort during pregnancy. Kegel exercises can be done anywhere and at any time. HOW TO PERFORM KEGEL EXERCISES 1. Locate your pelvic floor muscles. To do this, squeeze (contract) the muscles that you use when you try to stop the flow of urine. You will feel a tightness in the vaginal area (women) and a tight lift in the rectal area (men and women). 2. When you begin, contract your pelvic muscles tight for 2-5 seconds, then relax them for 2-5 seconds. This is one set. Do 4-5 sets with a short pause in between. 3. Contract your pelvic muscles for 8-10 seconds, then relax them for 8-10 seconds. Do 4-5 sets. If you cannot contract your pelvic muscles for 8-10 seconds, try 5-7 seconds and work your way up to 8-10 seconds. Your goal is 4-5 sets of 10 contractions each day. Keep your stomach, buttocks, and legs relaxed during the exercises. Perform sets of both short and long contractions. Vary your positions. Perform these contractions 3-4 times per day. Perform sets while you are:   Lying in bed in the morning.  Standing at lunch.  Sitting in the late afternoon.  Lying in bed at night. You should do 40-50 contractions per day. Do not perform more Kegel exercises per day than recommended. Overexercising can cause muscle fatigue. Continue  these exercises for for at least 15-20 weeks or as directed by your caregiver. Document Released: 11/12/2012 Document Reviewed: 11/12/2012 [facility] Patient Information 2015 [place], [place]. This information is not intended to replace advice given to you by your health care provider. Make sure you discuss any questions you have with your health care provider.  Place annual gynecologic exam patient  instructions here.

## 2015-08-17 NOTE — Progress Notes (Signed)
  Subjective:     Toni Vang is a 49 y.o. female and is here for a comprehensive physical exam. The patient reports vaginal dryness and low sex drive for 2 months.  Social History   Social History  . Marital Status: Married    Spouse Name: N/A  . Number of Children: N/A  . Years of Education: N/A   Occupational History  . Not on file.   Social History Main Topics  . Smoking status: Never Smoker   . Smokeless tobacco: Never Used  . Alcohol Use: No  . Drug Use: No  . Sexual Activity: Yes    Birth Control/ Protection: Surgical   Other Topics Concern  . Not on file   Social History Narrative   Health Maintenance  Topic Date Due  . HIV Screening  11/23/1981  . TETANUS/TDAP  11/23/1985  . PAP SMEAR  11/24/1987  . INFLUENZA VACCINE  07/11/2015    The following portions of the patient's history were reviewed and updated as appropriate: allergies, current medications, past family history, past medical history, past social history, past surgical history and problem list.  Review of Systems A comprehensive review of systems was negative.   Objective:    General appearance: alert, cooperative and appears stated age Neck: no adenopathy, no carotid bruit, no JVD, supple, symmetrical, trachea midline and thyroid not enlarged, symmetric, no tenderness/mass/nodules Lungs: clear to auscultation bilaterally Breasts: normal appearance, no masses or tenderness Heart: regular rate and rhythm, S1, S2 normal, no murmur, click, rub or gallop Abdomen: soft, non-tender; bowel sounds normal; no masses,  no organomegaly Pelvic: cervix normal in appearance, external genitalia normal, no adnexal masses or tenderness, positive findings: urethral prolapse with weak muscle tone, rectovaginal septum normal, uterus normal size, shape, and consistency and vagina normal without discharge    Assessment:    Healthy female exam. Low libido, dysparenia; mild urethracele with urge incontenance       Plan:  Pap obtained, routine labs and hormone labs obtained, MMG ordered; recommend increasing kegels; started on Prometrium nightly x3 weeks monthly. RTC 1 year or prn   See After Visit Summary for Counseling Recommendations

## 2015-08-18 LAB — COMPREHENSIVE METABOLIC PANEL
ALK PHOS: 90 IU/L (ref 39–117)
ALT: 14 IU/L (ref 0–32)
AST: 13 IU/L (ref 0–40)
Albumin/Globulin Ratio: 1.6 (ref 1.1–2.5)
Albumin: 4.7 g/dL (ref 3.5–5.5)
BUN/Creatinine Ratio: 14 (ref 9–23)
BUN: 9 mg/dL (ref 6–24)
Bilirubin Total: 0.3 mg/dL (ref 0.0–1.2)
CALCIUM: 9.8 mg/dL (ref 8.7–10.2)
CO2: 25 mmol/L (ref 18–29)
CREATININE: 0.66 mg/dL (ref 0.57–1.00)
Chloride: 99 mmol/L (ref 97–108)
GFR calc Af Amer: 121 mL/min/{1.73_m2} (ref >59)
GFR, EST NON AFRICAN AMERICAN: 105 mL/min/{1.73_m2} (ref >59)
GLOBULIN, TOTAL: 2.9 g/dL (ref 1.5–4.5)
GLUCOSE: 79 mg/dL (ref 65–99)
Potassium: 3.9 mmol/L (ref 3.5–5.2)
SODIUM: 140 mmol/L (ref 134–144)
Total Protein: 7.6 g/dL (ref 6.0–8.5)

## 2015-08-18 LAB — LIPID PANEL
CHOL/HDL RATIO: 3.7 ratio (ref 0.0–4.4)
CHOLESTEROL TOTAL: 218 mg/dL — AB (ref 100–199)
HDL: 59 mg/dL (ref >39)
LDL CALC: 119 mg/dL — AB (ref 0–99)
Triglycerides: 198 mg/dL — ABNORMAL HIGH (ref 0–149)
VLDL CHOLESTEROL CAL: 40 mg/dL (ref 5–40)

## 2015-08-18 LAB — ESTRADIOL: ESTRADIOL: 38.1 pg/mL

## 2015-08-18 LAB — PROGESTERONE: PROGESTERONE: 0.4 ng/mL

## 2015-08-18 LAB — DHEA-SULFATE: DHEA-SO4: 187.6 ug/dL (ref 41.2–243.7)

## 2015-08-18 LAB — FOLLICLE STIMULATING HORMONE: FSH: 100.2 m[IU]/mL

## 2015-08-18 LAB — VITAMIN D 25 HYDROXY (VIT D DEFICIENCY, FRACTURES): VIT D 25 HYDROXY: 25.4 ng/mL — AB (ref 30.0–100.0)

## 2015-08-19 LAB — CYTOLOGY - PAP

## 2015-08-22 ENCOUNTER — Telehealth: Payer: Self-pay | Admitting: *Deleted

## 2015-08-22 ENCOUNTER — Other Ambulatory Visit: Payer: Self-pay | Admitting: Obstetrics and Gynecology

## 2015-08-22 DIAGNOSIS — E559 Vitamin D deficiency, unspecified: Secondary | ICD-10-CM | POA: Insufficient documentation

## 2015-08-22 DIAGNOSIS — R7989 Other specified abnormal findings of blood chemistry: Secondary | ICD-10-CM

## 2015-08-22 MED ORDER — VITAMIN D (ERGOCALCIFEROL) 1.25 MG (50000 UNIT) PO CAPS: 50000.0000 [IU] | ORAL_CAPSULE | ORAL | Status: DC

## 2015-08-22 NOTE — Telephone Encounter (Signed)
-----   Message from Ulyses Amor, [place] sent at 08/22/2015 12:25 PM EDT ----- Please let her know pap and HPV wereboth negative

## 2015-08-22 NOTE — Telephone Encounter (Signed)
Notified pt of lab results she voiced understanding 

## 2015-08-22 NOTE — Telephone Encounter (Signed)
-----   Message from Ulyses Amor, [place] sent at 08/22/2015 12:29 PM EDT ----- Please let her knwo all labs were good except chol still elevated- too keep working on low chol diet, exercise and weight loss, will recheck next year. Vit D is low- needs to stay on supplement- I will send in weekly pill and want to rechekc in 3 months. She is definitely menopausal and some progesterone supplement may help sex drive issue she complained about, with no increase breast risk. I will send in nightly supplement- tell her to take one at bedtime for three weeks of every month, then take a week off.

## 2015-08-23 ENCOUNTER — Telehealth: Payer: Self-pay | Admitting: Obstetrics and Gynecology

## 2015-08-23 NOTE — Telephone Encounter (Signed)
Patient called stating she was supposed to have a script for vit d called in to her pharmacy and its not there.

## 2015-08-24 ENCOUNTER — Other Ambulatory Visit: Payer: Self-pay | Admitting: *Deleted

## 2015-08-24 MED ORDER — VITAMIN D (ERGOCALCIFEROL) 1.25 MG (50000 UNIT) PO CAPS: 50000.0000 [IU] | ORAL_CAPSULE | ORAL | Status: DC

## 2015-08-24 NOTE — Telephone Encounter (Signed)
Called pt rx faxed to [facility]

## 2015-09-13 ENCOUNTER — Encounter: Payer: Self-pay | Admitting: Sports Medicine

## 2015-09-13 ENCOUNTER — Ambulatory Visit: Payer: No Typology Code available for payment source

## 2015-09-13 ENCOUNTER — Ambulatory Visit (INDEPENDENT_AMBULATORY_CARE_PROVIDER_SITE_OTHER): Payer: No Typology Code available for payment source | Admitting: Sports Medicine

## 2015-09-13 DIAGNOSIS — B353 Tinea pedis: Secondary | ICD-10-CM | POA: Diagnosis not present

## 2015-09-13 DIAGNOSIS — M79671 Pain in right foot: Secondary | ICD-10-CM

## 2015-09-13 DIAGNOSIS — L603 Nail dystrophy: Secondary | ICD-10-CM

## 2015-09-13 DIAGNOSIS — M778 Other enthesopathies, not elsewhere classified: Secondary | ICD-10-CM

## 2015-09-13 DIAGNOSIS — M7751 Other enthesopathy of right foot: Secondary | ICD-10-CM

## 2015-09-13 DIAGNOSIS — M25571 Pain in right ankle and joints of right foot: Secondary | ICD-10-CM | POA: Diagnosis not present

## 2015-09-13 DIAGNOSIS — M779 Enthesopathy, unspecified: Secondary | ICD-10-CM

## 2015-09-13 MED ORDER — TERBINAFINE HCL 1 % EX CREA: 1.0000 "application " | TOPICAL_CREAM | Freq: Two times a day (BID) | CUTANEOUS | Status: DC

## 2015-09-13 MED ORDER — TRIAMCINOLONE ACETONIDE 10 MG/ML IJ SUSP: 10.0000 mg | Freq: Once | INTRAMUSCULAR | Status: DC

## 2015-09-13 NOTE — Progress Notes (Signed)
Subjective:    Patient ID: Toni Vang, female    DOB: 01-Sep-1966, 49 y.o.   MRN: [id number]  HPI Toni Vang 49 year old female patient present to office for pain in right foot. Reports that pain started about 3 weeks ago and feel like a deep stabbing pain to right foot/ankle that is made worse by wearing flats or non-supportive shoes. 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 Patient denies arthritic work-up. Patient denies any frank trauma or injury to right foot/ankle. Reports that the swelling has gotten better since pain started 3 weeks ago and that ice/heat helps.   Patient also is concerned about skin changes; dry, peeling, blistering to planar right foot and itchiness. Reports exposed to husband who has athlete's foot. Patient is also concern of left hallux nail changes; reports that the left hallux nail fell off >1 week ago and appeared to have a new nail under it; patient denies any trauma or consitutional symptoms or other pedal complaints at this time.  Patient Active Problem List   Diagnosis Date Noted  . Vitamin D deficiency 08/22/2015  . Low serum progesterone 08/22/2015    Current Outpatient Prescriptions on File Prior to Visit  Medication Sig Dispense Refill  . ALPRAZolam (XANAX) 0.5 MG tablet Take 1 tablet (0.5 mg total) by mouth 3 (three) times daily as needed for anxiety. 40 tablet 1  . cetirizine (ZYRTEC) 10 MG tablet Take 10 mg by mouth daily.    . fluticasone (FLONASE) 50 MCG/ACT nasal spray Place 1 spray into both nostrils 2 (two) times daily. 16 g 2  . hydrochlorothiazide (HYDRODIURIL) 25 MG tablet Take 1 tablet (25 mg total) by mouth daily. 30 tablet 5  . montelukast (SINGULAIR) 10 MG tablet Take 1 tablet (10 mg total) by mouth at bedtime. (Patient not taking: Reported on 08/17/2015) 30 tablet 2    . oxymetazoline (AFRIN NASAL SPRAY) 0.05 % nasal spray Place 1 spray into both nostrils 2 (two) times daily. 3 days maximum use 30 mL 0  . progesterone (PROMETRIUM) 200 MG capsule Take 1 capsule (200 mg total) by mouth daily. 60 capsule 4  . sodium chloride (OCEAN) 0.65 % SOLN nasal spray Place 2 sprays into both nostrils every 2 (two) hours while awake. (Patient not taking: Reported on 08/17/2015)  0  . Vitamin D, Ergocalciferol, (DRISDOL) 50000 UNITS CAPS capsule Take 1 capsule (50,000 Units total) by mouth every 7 (seven) days. 30 capsule 3   No current facility-administered medications on file prior to visit.   Allergies  Allergen Reactions  . Codeine Sulfate Nausea Only  . Erythromycin Nausea Only  . Latex     Review of Systems  Constitutional: Positive for fatigue.  Respiratory:       Asthma - Seasonal   Cardiovascular:       High or low BP    Gastrointestinal:       Irritable Bowel Syndrome   Musculoskeletal:       Muscle pain or cramps Swelling of the feet or legs  Skin:       Nail loss  All other systems reviewed and are negative.      Objective:   Physical Exam  Objective:  General: Well developed, nourished, in no acute distress, alert and oriented x3   Dermatological: Skin is warm,  dry and supple bilateral. Left hallux nail, short, and thin with mild discoloration at leading edge with no surrounding signs of infection; to right plantar foot dry scaling skin in annular fashion resembling tinea. There are no open sores, no preulcerative lesions, or signs of infection present.  Vascular: Dorsalis Pedis artery and Posterior Tibial artery pedal pulses are 2/4 bilateral with immedate capillary fill time. Pedal hair growth present. No varicosities and no lower extremity edema present bilateral.   Neruologic: Grossly intact via light touch bilateral.   Musculoskeletal: Asymptomatic mild hammertoes deformities bilateral. Pain with palpation to right sinus tarsi with  mild localized swelling. No tenderness with tuning fork to right foot. No  crepitus, or limitation noted with foot and ankle range of motion bilateral. Muscular strength 5/5 in all groups tested bilateral.  Gait: Unassisted, grossly Nonantalgic.   Xray: Right foot, 3 views No fracture, No dislocation, Asymptomatic enthesopathy calcaneus and navicular, Asymptomatic hammertoes, mild midtarsal breach, and 1st ray elevatus      Assessment & Plan:   Problem List Items Addressed This Visit    None    Visit Diagnoses    Right foot pain    -  Primary    Relevant Orders    DG Foot Complete Right    Sinus tarsi syndrome of right foot        Capsulitis of foot, right        Nail dystrophy        Left hallux    Tinea pedis of left foot        Relevant Medications    terbinafine (LAMISIL AT) 1 % cream      -Complete examination performed -Treatment options discussed for all concerns at today's visits includings risks, benefits, and alternatives; all patient's questions answered -At this time will observe left hallux nail and monitor regrowth and for problems or signs of infection; dremel rough leading edge of nail at today's visit -Rx terbinafine cream to apply to left foot at site of fungal skin changes and monitor -Recommended steroid injection for sinus tarsi pain however patient refused at this visit. -Dispensed fascial brace strapped from medial to lateral to alleivate pressure and strain to lateral foot, sinus tarsi and ankle on right -Advised patient to strongly consider other treatment options as discussed for right foot pain and that if symptoms fail to improve to return for further evaluation and treatment.  -Patient to return in 4 weeks for recheck or sooner if problems arise/condition worsens.  -May consider arthritic workup as well in future if symptoms persist.   Asencion Islam, DPM

## 2016-02-09 ENCOUNTER — Ambulatory Visit: Payer: Self-pay | Admitting: Family

## 2016-02-09 VITALS — BP 130/70 | HR 89 | Temp 98.3°F

## 2016-02-09 DIAGNOSIS — H6123 Impacted cerumen, bilateral: Secondary | ICD-10-CM

## 2016-02-10 NOTE — Progress Notes (Signed)
See note in paper chart by Heather Ratcliffe, PAC  

## 2016-02-20 ENCOUNTER — Ambulatory Visit: Payer: Self-pay | Admitting: Physician Assistant

## 2016-02-20 ENCOUNTER — Encounter: Payer: Self-pay | Admitting: Physician Assistant

## 2016-02-20 VITALS — BP 144/55 | HR 77 | Temp 97.5°F

## 2016-02-20 DIAGNOSIS — T467X5A Adverse effect of peripheral vasodilators, initial encounter: Secondary | ICD-10-CM

## 2016-02-20 NOTE — Progress Notes (Signed)
S: [facility], thinks has niacin in it, has taken without difficulty for last 2 days, now since she took it on empty stomach this morning her chest has flushed, her ears turned red, she had tingling all over, denies cp/sob/or throat swelling  O: vitals wnl, nad, lungs c t a, cv rrr, skin flushed on chest  A: reaction to niacin, flushing  P: drink plenty of fluids, if rash doesn't go away in 1 hour then use benadryl, discussed side effects of niacin, allergic reactions, etc

## 2016-04-08 ENCOUNTER — Ambulatory Visit
Admission: EM | Admit: 2016-04-08 | Discharge: 2016-04-08 | Disposition: A | Discharge status: Home / Self Care | Payer: Managed Care, Other (non HMO) | Attending: Family Medicine | Admitting: Family Medicine

## 2016-04-08 ENCOUNTER — Encounter: Payer: Self-pay | Admitting: Emergency Medicine

## 2016-04-08 DIAGNOSIS — B349 Viral infection, unspecified: Secondary | ICD-10-CM

## 2016-04-08 LAB — RAPID STREP SCREEN (MED CTR MEBANE ONLY): STREPTOCOCCUS, GROUP A SCREEN (DIRECT): NEGATIVE

## 2016-04-08 LAB — RAPID INFLUENZA A&B ANTIGENS
Influenza A (ARMC): NEGATIVE
Influenza B (ARMC): NEGATIVE

## 2016-04-08 LAB — MONONUCLEOSIS SCREEN: MONO SCREEN: NEGATIVE

## 2016-04-08 NOTE — ED Provider Notes (Signed)
CSN: [id number]     Arrival date & time 04/08/16  1454 History   First MD Initiated Contact with Patient 04/08/16 1616     Chief Complaint  Patient presents with  . Joint Pain  . Eye Pain   (Consider location/radiation/quality/duration/timing/severity/associated sxs/prior Treatment) HPI Comments: 50 yo female with a c/o bilateral knee pain and bilateral eye redness and drainage that started last night associated with fatigue, low energy.  Denies any fevers, chills, recent illness, cough, chest pain, shortness of breath, joint redness or swelling, vomiting, diarrhea, abdominal pain, tick bites, insect bites, rash.   States has been around people with viral URIs and mononucleosis.   Patient is a 50 y.o. female presenting with eye pain. The history is provided by the patient.  Eye Pain    Past Medical History  Diagnosis Date  . Choriocarcinoma (HCC)   . Hypertension   . Acid reflux   . Arthritis    Past Surgical History  Procedure Laterality Date  . Nasal sinus surgery    . Laparoscopy    . Abdominal hysterectomy    . Bladder surgery     Family History  Problem Relation Age of Onset  . Diabetes Mother   . Hypertension Mother   . Hypertension Father   . Cancer Maternal Aunt     ovarian  . Diabetes Maternal Grandmother   . Diabetes Maternal Grandfather    Social History  Substance Use Topics  . Smoking status: Never Smoker   . Smokeless tobacco: Never Used  . Alcohol Use: No   OB History    Gravida Para Term Preterm AB TAB SAB Ectopic Multiple Living   4 2 2  1  1   3      Review of Systems  Eyes: Positive for pain.    Allergies  Codeine sulfate; Erythromycin; and Latex  Home Medications   Prior to Admission medications   Medication Sig Start Date End Date Taking? Authorizing Provider  cetirizine (ZYRTEC) 10 MG tablet Take 10 mg by mouth daily.    Historical Provider, MD  fluticasone (FLONASE) 50 MCG/ACT nasal spray Place 1 spray into both nostrils 2 (two)  times daily. 06/02/15   Barbaraann Barthelina A Betancourt, NP   Meds Ordered and Administered this Visit  Medications - No data to display  BP 119/68 mmHg  Pulse 74  Temp(Src) 97.8 F (36.6 C) (Tympanic)  Resp 16  Ht 5\' 8"  (1.727 m)  Wt 182 lb (82.555 kg)  BMI 27.68 kg/m2  SpO2 100% No data found.   Physical Exam  Constitutional: She appears well-developed and well-nourished. No distress.  HENT:  Head: Normocephalic and atraumatic.  Right Ear: Tympanic membrane, external ear and ear canal normal.  Left Ear: Tympanic membrane, external ear and ear canal normal.  Nose: No mucosal edema, rhinorrhea, nose lacerations, sinus tenderness, nasal deformity, septal deviation or nasal septal hematoma. No epistaxis.  No foreign bodies.  Mouth/Throat: Uvula is midline, oropharynx is clear and moist and mucous membranes are normal. No oropharyngeal exudate.  Eyes: Conjunctivae and EOM are normal. Pupils are equal, round, and reactive to light. Right eye exhibits no discharge. Left eye exhibits no discharge. No scleral icterus.  Mild conjunctival injection bilaterally; no drainage  Neck: Normal range of motion. Neck supple. No thyromegaly present.  Cardiovascular: Normal rate, regular rhythm and normal heart sounds.   Pulmonary/Chest: Effort normal and breath sounds normal. No respiratory distress. She has no wheezes. She has no rales.  Musculoskeletal: She exhibits no  edema or tenderness.       Right knee: Normal.       Left knee: Normal.  Lymphadenopathy:    She has no cervical adenopathy.  Neurological: She is alert.  Skin: No rash noted. She is not diaphoretic.  Nursing note and vitals reviewed.   ED Course  Procedures (including critical care time)  Labs Review Labs Reviewed  RAPID INFLUENZA A&B ANTIGENS ([facility] ONLY)  RAPID STREP SCREEN (NOT AT [facility])  CULTURE, GROUP A STREP [facility])  MONONUCLEOSIS SCREEN    Imaging Review No results found.   Visual Acuity Review  Right Eye Distance: 20/20  corrected Left Eye Distance: 20/20 corrected Bilateral Distance:    Right Eye Near:   Left Eye Near:    Bilateral Near:         MDM   1. Viral syndrome    Discharge Medication List as of 04/08/2016  5:00 PM     1. Lab results and diagnosis reviewed with patient 2. Recommend supportive treatment with otc analgesics/NSAIDs prn; rest, increased fluids  3. Recommend checking titers for tick borne illnesses (Lyme, RMSF, erlichia), however patient refuses 4. Recommend monitoring and  follow-up prn if symptoms worsen or don't improve    Payton Mccallum, MD 04/08/16 1724

## 2016-04-08 NOTE — ED Notes (Signed)
Patient c/o pain in both her knees and pain and drainage from both eyes that started last night.

## 2016-04-10 LAB — CULTURE, GROUP A STREP (THRC)

## 2016-10-22 ENCOUNTER — Encounter: Payer: Self-pay | Admitting: Physician Assistant

## 2016-10-22 ENCOUNTER — Ambulatory Visit: Payer: Self-pay | Admitting: Physician Assistant

## 2016-10-22 VITALS — BP 130/80 | HR 100 | Temp 98.7°F

## 2016-10-22 DIAGNOSIS — R030 Elevated blood-pressure reading, without diagnosis of hypertension: Secondary | ICD-10-CM

## 2016-10-22 DIAGNOSIS — F411 Generalized anxiety disorder: Secondary | ICD-10-CM

## 2016-10-22 MED ORDER — SERTRALINE HCL 25 MG PO TABS: 25.0000 mg | ORAL_TABLET | Freq: Every day | ORAL | 4 refills | Status: DC

## 2016-10-22 MED ORDER — ALPRAZOLAM 0.5 MG PO TABS: 0.5000 mg | ORAL_TABLET | Freq: Three times a day (TID) | ORAL | 0 refills | Status: DC

## 2016-10-22 NOTE — Progress Notes (Signed)
S: c/o anxiety and feeling stressed due to work situation. 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

## 2017-04-11 ENCOUNTER — Other Ambulatory Visit: Payer: Self-pay | Admitting: Family Medicine

## 2017-04-11 DIAGNOSIS — Z1231 Encounter for screening mammogram for malignant neoplasm of breast: Secondary | ICD-10-CM

## 2017-04-22 ENCOUNTER — Ambulatory Visit
Admission: RE | Admit: 2017-04-22 | Discharge: 2017-04-22 | Disposition: A | Discharge status: Home / Self Care | Payer: Managed Care, Other (non HMO) | Source: Ambulatory Visit | Attending: Family Medicine | Admitting: Family Medicine

## 2017-04-22 DIAGNOSIS — Z1231 Encounter for screening mammogram for malignant neoplasm of breast: Secondary | ICD-10-CM | POA: Diagnosis not present

## 2017-04-22 HISTORY — DX: Personal history of antineoplastic chemotherapy: Z92.21

## 2017-05-02 ENCOUNTER — Ambulatory Visit (INDEPENDENT_AMBULATORY_CARE_PROVIDER_SITE_OTHER): Payer: Managed Care, Other (non HMO) | Admitting: Obstetrics and Gynecology

## 2017-05-02 ENCOUNTER — Encounter: Payer: Self-pay | Admitting: Obstetrics and Gynecology

## 2017-05-02 VITALS — BP 136/83 | HR 79 | Ht 68.0 in | Wt 191.1 lb

## 2017-05-02 DIAGNOSIS — Z111 Encounter for screening for respiratory tuberculosis: Secondary | ICD-10-CM

## 2017-05-02 DIAGNOSIS — E559 Vitamin D deficiency, unspecified: Secondary | ICD-10-CM | POA: Diagnosis not present

## 2017-05-02 DIAGNOSIS — R5383 Other fatigue: Secondary | ICD-10-CM | POA: Diagnosis not present

## 2017-05-02 DIAGNOSIS — Z01419 Encounter for gynecological examination (general) (routine) without abnormal findings: Secondary | ICD-10-CM | POA: Diagnosis not present

## 2017-05-02 MED ORDER — ALPRAZOLAM 0.5 MG PO TABS: 0.5000 mg | ORAL_TABLET | Freq: Three times a day (TID) | ORAL | 2 refills | Status: DC

## 2017-05-02 NOTE — Progress Notes (Signed)
Subjective:   Toni Vang is a 51 y.o. G55P2013 Caucasian female here for a routine well-woman exam.  No LMP recorded. Patient has had a hysterectomy.    Current complaints: fatigue and no sex drive worse last few months, is retiring to take care of parents and spouse PCP: me       does desire labs  Social History: Sexual: heterosexual Marital Status: married Living situation: with family Occupation: retired Tobacco/alcohol: no tobacco use Illicit drugs: no history of illicit drug use  The following portions of the patient's history were reviewed and updated as appropriate: allergies, current medications, past family history, past medical history, past social history, past surgical history and problem list.  Past Medical History Past Medical History:  Diagnosis Date  . Acid reflux   . Arthritis   . 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 Hypertension   . Personal history of chemotherapy   . Personal history of radiation therapy     Past Surgical History Past Surgical History:  Procedure Laterality Date  . ABDOMINAL HYSTERECTOMY    . BLADDER SURGERY    . LAPAROSCOPY    . NASAL SINUS SURGERY      Gynecologic History [id number]  No LMP recorded. Patient has had a hysterectomy. Contraception: tubal ligation Last Pap: 2016. Results were: normal Last mammogram: 04/2017. Results were: normal   Obstetric History OB History  Gravida Para Term Preterm AB Living  4 2 2   1 3   SAB TAB Ectopic Multiple Live Births  1       3    # Outcome Date GA Lbr Len/2nd Weight Sex Delivery Anes PTL Lv  4 Term 2002    F Vag-Spont   LIV  3 Gravida 1999    M Vag-Spont   LIV  2 Term 1992    M Vag-Spont   LIV  1 SAB               Current Medications Current Outpatient Prescriptions on File Prior to Visit  Medication Sig Dispense Refill  . ALPRAZolam (XANAX) 0.5 MG tablet Take 1 tablet (0.5 mg total) by mouth 3 (three) times daily. 10 tablet 0  . cetirizine (ZYRTEC) 10 MG tablet Take 10  mg by mouth daily.    . sertraline (ZOLOFT) 25 MG tablet Take 1 tablet (25 mg total) by mouth daily. (Patient not taking: Reported on 05/02/2017) 30 tablet 4   No current facility-administered medications on file prior to visit.     Review of Systems Patient denies any headaches, blurred vision, shortness of breath, chest pain, abdominal pain, problems with bowel movements, urination, or intercourse.  Objective:  BP 136/83   Pulse 79   Ht 5\' 8"  (1.727 m)   Wt 191 lb 1.6 oz (86.7 kg)   BMI 29.06 kg/m  Physical Exam  General:  Well developed, well nourished, no acute distress. She is alert and oriented x3. Skin:  Warm and dry Neck:  Midline trachea, no thyromegaly or nodules Cardiovascular: Regular rate and rhythm, no murmur heard Lungs:  Effort normal, all lung fields clear to auscultation bilaterally Breasts:  No dominant palpable mass, retraction, or nipple discharge Abdomen:  Soft, non tender, no hepatosplenomegaly or masses Pelvic:  External genitalia is normal in appearance.  The vagina is normal in appearance. The cervix is bulbous, no CMT.  Thin prep pap is not done. Uterus is felt to be normal size, shape, and contour.  No adnexal masses or tenderness  noted. Extremities:  No swelling or varicosities noted Psych:  She has a normal mood and affect  Assessment:   Healthy well-woman exam  Plan:  Health form for work filled out F/U 1 year for AE, or sooner if needed  Colonoscopy due  Noah Pelaez Suzan NailerN Francies Inch, CNM

## 2017-05-03 LAB — THYROID PANEL WITH TSH
Free Thyroxine Index: 1.5 (ref 1.2–4.9)
T3 Uptake Ratio: 21 % — ABNORMAL LOW (ref 24–39)
T4, Total: 7.2 ug/dL (ref 4.5–12.0)
TSH: 1.51 u[IU]/mL (ref 0.450–4.500)

## 2017-05-03 LAB — CBC
HEMATOCRIT: 38.3 % (ref 34.0–46.6)
Hemoglobin: 13.3 g/dL (ref 11.1–15.9)
MCH: 29.3 pg (ref 26.6–33.0)
MCHC: 34.7 g/dL (ref 31.5–35.7)
MCV: 84 fL (ref 79–97)
Platelets: 364 10*3/uL (ref 150–379)
RBC: 4.54 x10E6/uL (ref 3.77–5.28)
RDW: 14.1 % (ref 12.3–15.4)
WBC: 9.5 10*3/uL (ref 3.4–10.8)

## 2017-05-03 LAB — COMPREHENSIVE METABOLIC PANEL
A/G RATIO: 1.7 (ref 1.2–2.2)
ALK PHOS: 100 IU/L (ref 39–117)
ALT: 18 IU/L (ref 0–32)
AST: 19 IU/L (ref 0–40)
Albumin: 4.7 g/dL (ref 3.5–5.5)
BUN/Creatinine Ratio: 10 (ref 9–23)
BUN: 8 mg/dL (ref 6–24)
Bilirubin Total: <0.2 mg/dL (ref 0.0–1.2)
CALCIUM: 9.5 mg/dL (ref 8.7–10.2)
CO2: 26 mmol/L (ref 18–29)
Chloride: 101 mmol/L (ref 96–106)
Creatinine, Ser: 0.79 mg/dL (ref 0.57–1.00)
GFR calc Af Amer: 101 mL/min/{1.73_m2} (ref >59)
GFR, EST NON AFRICAN AMERICAN: 88 mL/min/{1.73_m2} (ref >59)
Globulin, Total: 2.8 g/dL (ref 1.5–4.5)
Glucose: 78 mg/dL (ref 65–99)
POTASSIUM: 4.6 mmol/L (ref 3.5–5.2)
SODIUM: 141 mmol/L (ref 134–144)
Total Protein: 7.5 g/dL (ref 6.0–8.5)

## 2017-05-03 LAB — FERRITIN: Ferritin: 48 ng/mL (ref 15–150)

## 2017-05-03 LAB — B12 AND FOLATE PANEL
FOLATE: 11.3 ng/mL (ref >3.0)
VITAMIN B 12: 541 pg/mL (ref 232–1245)

## 2017-05-03 LAB — LIPID PANEL
Chol/HDL Ratio: 3.7 ratio (ref 0.0–4.4)
Cholesterol, Total: 217 mg/dL — ABNORMAL HIGH (ref 100–199)
HDL: 58 mg/dL (ref >39)
LDL CALC: 126 mg/dL — AB (ref 0–99)
TRIGLYCERIDES: 166 mg/dL — AB (ref 0–149)
VLDL CHOLESTEROL CAL: 33 mg/dL (ref 5–40)

## 2017-05-03 LAB — VITAMIN D 25 HYDROXY (VIT D DEFICIENCY, FRACTURES): VIT D 25 HYDROXY: 27.2 ng/mL — AB (ref 30.0–100.0)

## 2017-05-06 LAB — QUANTIFERON IN TUBE
QFT TB AG MINUS NIL VALUE: 0.04 IU/mL
QUANTIFERON MITOGEN VALUE: 8.49 IU/mL
QUANTIFERON TB AG VALUE: 0.1 IU/mL
QUANTIFERON TB GOLD: NEGATIVE
Quantiferon Nil Value: 0.06 IU/mL

## 2017-05-06 LAB — QUANTIFERON TB GOLD ASSAY (BLOOD)

## 2017-05-08 ENCOUNTER — Telehealth: Payer: Self-pay | Admitting: Obstetrics and Gynecology

## 2017-05-08 NOTE — Telephone Encounter (Signed)
Patient would like to know results from last labs and also know if the A.B.S.S. Physical form that was given to Amy needs to be picked up or will it be mailed, "I advised the patient that the results may not be in, and we have not had the time to contact her, Please advise.

## 2017-05-08 NOTE — Telephone Encounter (Signed)
Notified pt form is ready

## 2017-07-08 ENCOUNTER — Ambulatory Visit: Payer: Self-pay | Admitting: Physician Assistant

## 2017-07-08 VITALS — BP 120/80 | HR 73 | Temp 98.5°F | Resp 16

## 2017-07-08 DIAGNOSIS — M25561 Pain in right knee: Secondary | ICD-10-CM

## 2017-07-08 DIAGNOSIS — H9201 Otalgia, right ear: Secondary | ICD-10-CM

## 2017-07-08 DIAGNOSIS — M25562 Pain in left knee: Principal | ICD-10-CM

## 2017-07-08 MED ORDER — AZITHROMYCIN 250 MG PO TABS: ORAL_TABLET | ORAL | 0 refills | Status: DC

## 2017-07-08 MED ORDER — PREDNISONE 10 MG PO TABS: 30.0000 mg | ORAL_TABLET | Freq: Every day | ORAL | 0 refills | Status: DC

## 2017-07-08 MED ORDER — MELOXICAM 15 MG PO TABS
15.0000 mg | ORAL_TABLET | Freq: Every day | ORAL | 3 refills | Status: DC
Start: 2017-07-08 — End: 2018-02-12

## 2017-07-08 NOTE — Progress Notes (Signed)
S: c/o 1. both knees hurting and grinding, states she has been helping her husband paint and noticed that her knees are hurting a lot more than normal, no known injury, sx for about a month, taking ibuprofen without relief,  2. C/o ear pain b/l, r side is worse, went swimming and thinks it made it worse, no fever/chills, no cough or congestion  O: vitals wnl, both ears + wax in canals, irrigated with warm water, small amount of wax removed;  r tm is red/swollen/dull, left ear is dull, throat is wnl, neck supple no lymph, lungs c t a, cv rrr, both knees have crepitus with extension, no bony tenderness , full rom, n/v intact  A: ear pain, knee pain, irrigation of ears  P: mobic 15mg  qd, f/u with sports med for eval, zpack, pred 30mg  qd x 3d for ear pain

## 2017-10-25 ENCOUNTER — Encounter: Payer: Self-pay | Admitting: Obstetrics and Gynecology

## 2017-10-29 ENCOUNTER — Ambulatory Visit (INDEPENDENT_AMBULATORY_CARE_PROVIDER_SITE_OTHER): Payer: Managed Care, Other (non HMO) | Admitting: Obstetrics and Gynecology

## 2017-10-29 VITALS — BP 129/76 | HR 77 | Wt 187.1 lb

## 2017-10-29 DIAGNOSIS — E663 Overweight: Secondary | ICD-10-CM | POA: Diagnosis not present

## 2017-10-30 ENCOUNTER — Encounter: Payer: Self-pay | Admitting: Obstetrics and Gynecology

## 2017-10-30 MED ORDER — PHENTERMINE HCL 37.5 MG PO TABS: 37.5000 mg | ORAL_TABLET | Freq: Every day | ORAL | 2 refills | Status: DC

## 2017-10-30 NOTE — Progress Notes (Signed)
Pt is here to restart weight loss medication, she is doing well, trying to exercise.

## 2017-11-15 ENCOUNTER — Encounter: Payer: Self-pay | Admitting: Physician Assistant

## 2017-11-15 ENCOUNTER — Ambulatory Visit: Payer: Self-pay | Admitting: Physician Assistant

## 2017-11-15 VITALS — BP 122/84 | HR 83 | Temp 98.3°F

## 2017-11-15 DIAGNOSIS — J209 Acute bronchitis, unspecified: Secondary | ICD-10-CM

## 2017-11-15 MED ORDER — SULFAMETHOXAZOLE-TRIMETHOPRIM 800-160 MG PO TABS: 1.0000 | ORAL_TABLET | Freq: Two times a day (BID) | ORAL | 0 refills | Status: DC

## 2017-11-15 MED ORDER — PSEUDOEPH-BROMPHEN-DM 30-2-10 MG/5ML PO SYRP: 5.0000 mL | ORAL_SOLUTION | Freq: Four times a day (QID) | ORAL | 0 refills | Status: DC | PRN

## 2017-11-15 NOTE — Progress Notes (Signed)
   Subjective: Cough     Patient ID: Toni Vang, female    DOB: 19-Jul-1966, 51 y.o.   MRN: [id number]  HPI Patient complaining a nonproductive cough for 2 weeks. Patient also complaining of sinus congestion and intermittent rhinorrhea. Patient denies fever, nausea, vomiting, diarrhea. No relief with OTC medication.  Review of Systems    negative for complaint Objective:   Physical Exam  HEENT unremarkable. Neck supple without  adenopathy. Lungs bilateral Rales in heart regular rate and rhythm.      Assessment & Plan: Bronchitis   Patient given discharge Instructions and a prescription for Bromfed-DM and Bactrim DS. Patient advised follow-up with PCP if no improvement in 5 days

## 2017-12-03 ENCOUNTER — Other Ambulatory Visit: Payer: Self-pay | Admitting: Obstetrics and Gynecology

## 2017-12-03 DIAGNOSIS — E663 Overweight: Secondary | ICD-10-CM

## 2018-01-16 ENCOUNTER — Ambulatory Visit: Payer: Self-pay | Admitting: Medical

## 2018-01-16 VITALS — BP 149/88 | HR 95 | Temp 98.5°F | Resp 16

## 2018-01-16 DIAGNOSIS — M7032 Other bursitis of elbow, left elbow: Secondary | ICD-10-CM

## 2018-01-16 DIAGNOSIS — J0100 Acute maxillary sinusitis, unspecified: Secondary | ICD-10-CM

## 2018-01-16 MED ORDER — PREDNISONE 10 MG (21) PO TBPK: ORAL_TABLET | ORAL | 0 refills | Status: DC

## 2018-01-16 MED ORDER — AMOXICILLIN-POT CLAVULANATE 875-125 MG PO TABS: 1.0000 | ORAL_TABLET | Freq: Two times a day (BID) | ORAL | 0 refills | Status: DC

## 2018-01-16 NOTE — Progress Notes (Signed)
   Subjective:    Patient ID: Orlean PattenMyra L Bradfield, female    DOB: 10/01/1966, 52 y.o.   MRN: [id number]  HPI 52 yo female in non acute distress. Started 7 days with cheek pain Left>right side, ears stopped up and pressure. Feels face is puffy.   Second problem is left elbow swelling and pain .  Review of Systems  Constitutional: Negative for chills and fever.  HENT: Positive for congestion, facial swelling, postnasal drip, sinus pressure, sinus pain and sore throat (mild). Negative for ear discharge, ear pain and rhinorrhea.   Respiratory: Negative for cough and shortness of breath.   Cardiovascular: Negative for chest pain.  Musculoskeletal: Negative for joint swelling.  Neurological: Negative for dizziness, syncope and light-headedness.       Objective:   Physical Exam  Constitutional: She appears well-developed and well-nourished.  HENT:  Head: Normocephalic and atraumatic.  Right Ear: Hearing, external ear and ear canal normal. A middle ear effusion is present.  Left Ear: Hearing, external ear and ear canal normal. A middle ear effusion is present.  Nose: Mucosal edema and rhinorrhea present.  Mouth/Throat: Uvula is midline, oropharynx is clear and moist and mucous membranes are normal.  Eyes: Conjunctivae, EOM and lids are normal. Pupils are equal, round, and reactive to light.  Musculoskeletal:       Right shoulder: She exhibits tenderness, swelling and pain. She exhibits normal range of motion, no bony tenderness and no crepitus.       Arms: Nursing note and vitals reviewed.   Superior to elbow with swelling no erythema, no warmth. No bony tenderness, FROM  2+ radial pulses.       Assessment & Plan:  Sinusitis/ Bursitis left elbow Meds ordered this encounter  Medications  . amoxicillin-clavulanate (AUGMENTIN) 875-125 MG tablet    Sig: Take 1 tablet by mouth 2 (two) times daily.    Dispense:  20 tablet    Refill:  0  . predniSONE (STERAPRED UNI-PAK 21 TAB) 10 MG (21)  TBPK tablet    Sig: Take 6 tablets by mouth today then  5 tablets tomorrow then one tablet less each day thereafter,take with food    Dispense:  21 tablet    Refill:  0   OTC Flonase continue daily, continue OTC Zyrtec take as directed. Return in 3-5 days if not improving. Return in one week if elbow not improving. Patient verbalizes understanding and has no questions at discharge.

## 2018-02-12 ENCOUNTER — Encounter: Payer: Self-pay | Admitting: Family Medicine

## 2018-02-12 ENCOUNTER — Ambulatory Visit: Payer: Self-pay | Admitting: Family Medicine

## 2018-02-12 VITALS — BP 130/80 | HR 90 | Temp 98.5°F | Resp 18

## 2018-02-12 DIAGNOSIS — M7712 Lateral epicondylitis, left elbow: Secondary | ICD-10-CM

## 2018-02-12 NOTE — Progress Notes (Signed)
Subjective: elbow pain     Toni Vang is a 52 y.o. female who presents with left elbow pain. Onset of the symptoms was about a month ago. Inciting event: none known. Current symptoms include: point tenderness lateral epicondyle. 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 Symptoms have improved singificantly since taking prednisone but she reports lingering pain. Patient has had no prior elbow problems. Evaluation to date: none. Treatment to date: OTC analgesics occasionally for pain, Voltaren cream, TENS, and prednisone.   Review of Systems Pertinent items noted in HPI and remainder of comprehensive ROS otherwise negative.   Objective:    BP 130/80 (BP Location: Right Arm, Patient Position: Sitting, Cuff Size: Large)   Pulse 90   Temp 98.5 F (36.9 C) (Oral)   Resp 18   SpO2 99%  Right elbow: without deformity and full active ROM  Left elbow:  without deformity, full active ROM and tenderness over lateral epicondyle    Assessment:    left lateral epicondylitis     Patient was seen in this clinic last month and diagnosed with olecranon bursitis but I believe her symptoms are more consistent with lateral epicondylitis.  Plan:    Natural history and expected course discussed. Questions answered. Rest, ice, compression, and elevation (RICE) therapy. Reduction in offending activity. Recommended taking aleve regularly for the next few weeks to control inflammation.  Discussed GI protection with omeprazole over-the-counter.  Discussed adverse /side effects these medications.  Discussed following up with her primary doctor after 4 weeks of conservative therapy if she has not had any improvement in symptoms.  Patient said she would prefer to see an orthopedist at that time and told the patient she could call for a referral.  Advised her that PT is an alternative option at that time.  Red flag symptoms and indications to return to care  discussed.

## 2018-04-21 ENCOUNTER — Ambulatory Visit: Payer: Self-pay | Admitting: Family Medicine

## 2018-04-21 VITALS — BP 122/80 | HR 87 | Temp 98.5°F | Resp 20 | Wt 184.7 lb

## 2018-04-21 DIAGNOSIS — R12 Heartburn: Secondary | ICD-10-CM

## 2018-04-21 DIAGNOSIS — K219 Gastro-esophageal reflux disease without esophagitis: Secondary | ICD-10-CM

## 2018-04-21 MED ORDER — OMEPRAZOLE 20 MG PO CPDR: DELAYED_RELEASE_CAPSULE | ORAL | 0 refills | Status: DC

## 2018-04-21 NOTE — Progress Notes (Addendum)
Subjective: "heartburn"     Toni Vang is an 52 y.o. female who presents for evaluation of heartburn. This has been associated with symptoms primarily relate to meals, and lying down after meals. She denies abdominal bloating, bilious reflux, choking on food, cough, difficulty swallowing, dysphagia, early satiety, hematemesis, hoarseness, laryngitis, melena, midespigastric pain, nausea, vomiting, diarrhea, need to clear throat frequently, odynophagia, regurgitation of undigested food, shortness of breath, unexpected weight loss, upper abdominal discomfort, or wheezing. Symptoms have been present for 5 days.She has lost 10 pounds recently, but this is due to starting weight watchers.  Patient reports a personal history of GERD with similar symptoms.  Patient believes symptoms have worsened related to the recent stress of her dog dying recently, recently starting a new job with erratic eating habits, and a new diet.  Patient reports that at her new job she has to go long periods of time without eating and then eat a lot to make up for it.  Patient reports symptoms are worse after meals and not present otherwise.  Patient reports eating a large chocolate frosty last night just prior to laying down to go to sleep and that this caused significant symptoms of heartburn.  Patient reports symptoms are positional, with laying down exacerbating symptoms.  Patient reports having a difficult time sleeping due to the symptoms.  Patient reports symptoms resolve completely after she takes aluminum hydroxide/magnesium carbonate.  Patient's husband is an EMT and provided her with a nitro tablet to see if this would help her symptoms, but they remained unchanged after taking nitro.  Patient does not feel that this is cardiac in origin. She denies melena, hematochezia, hematemesis, and coffee ground emesis. Medical therapy in the past has included: antacids, H2 antagonists and proton pump inhibitors.  Denies chest pain, shortness  of breath, palpitations, diaphoresis, nausea/vomiting, dizziness, or peripheral edema.  Patient reports symptoms do not coincide with exertion, in fact she has been exerting herself a lot recently  at her new job and symptoms are not present at all with activity.  Denies personal or family CVD history. Patient denies any medical history is only currently taking Zyrtec as needed.  Denies alcohol or frequent NSAID use.  Denies any other relevant history.  Review of Systems Pertinent items noted in HPI and remainder of comprehensive ROS otherwise negative.   Objective:     BP 122/80 (BP Location: Left Arm, Patient Position: Sitting, Cuff Size: Normal)   Pulse 87   Temp 98.5 F (36.9 C) (Oral)   Resp 20   Wt 184 lb 11.2 oz (83.8 kg)   SpO2 99%   BMI 28.08 kg/m  General appearance: alert, cooperative, appears stated age and no distress Throat: lips, mucosa, and tongue normal; teeth and gums normal Lungs: clear to auscultation bilaterally Heart: regular rate and rhythm, S1, S2 normal, no murmur, click, rub or gallop Abdomen: soft, non-tender; bowel sounds normal; no masses,  no organomegaly   Assessment:    Gastroesophageal Reflux Disease    Plan:    Nonpharmacologic treatments were discussed including: eating smaller meals, avoiding eating 2-3 hours before bed, elevation of the head of bed at night, avoidance of caffeine, chocolate, nicotine and peppermint, and avoiding tight fitting clothing.  Provided in depth detail regarding lifestyle modifications and discussed creating a symptom diary. Will start a trial of proton pump inhibitors.  Side/adverse effects discussed. H. pylori antibody ordered.  Discussed limitations of this test with the patient. Advised patient to attend gastroenterologist appointment that  she has scheduled.  Patient has a follow-up appointment with her primary care provider in the next 2 weeks, advised patient to attend this.  Follow-up with primary care provider  sooner if needed. Red flag symptoms and indications to seek medical care discussed.  04/28/18: Patient requesting referral to [facility] at [facility]. Referral papers faxed.

## 2018-04-22 ENCOUNTER — Encounter: Payer: Self-pay | Admitting: Family Medicine

## 2018-04-22 LAB — H. PYLORI ANTIBODY, IGG: H. pylori, IgG AbS: <0.8 Index Value (ref 0.00–0.79)

## 2018-04-28 NOTE — Addendum Note (Signed)
Addended by: Frances Maywood on: 04/28/2018 08:34 AM   Modules accepted: Orders

## 2018-05-08 ENCOUNTER — Ambulatory Visit (INDEPENDENT_AMBULATORY_CARE_PROVIDER_SITE_OTHER): Payer: Managed Care, Other (non HMO) | Admitting: Obstetrics and Gynecology

## 2018-05-08 ENCOUNTER — Encounter: Payer: Self-pay | Admitting: Obstetrics and Gynecology

## 2018-05-08 VITALS — BP 127/84 | HR 87 | Ht 68.0 in | Wt 182.4 lb

## 2018-05-08 DIAGNOSIS — R7989 Other specified abnormal findings of blood chemistry: Secondary | ICD-10-CM | POA: Diagnosis not present

## 2018-05-08 DIAGNOSIS — Z01419 Encounter for gynecological examination (general) (routine) without abnormal findings: Secondary | ICD-10-CM | POA: Diagnosis not present

## 2018-05-08 DIAGNOSIS — T148XXA Other injury of unspecified body region, initial encounter: Secondary | ICD-10-CM

## 2018-05-08 DIAGNOSIS — R5383 Other fatigue: Secondary | ICD-10-CM | POA: Diagnosis not present

## 2018-05-08 DIAGNOSIS — Z01411 Encounter for gynecological examination (general) (routine) with abnormal findings: Secondary | ICD-10-CM | POA: Diagnosis not present

## 2018-05-08 DIAGNOSIS — E559 Vitamin D deficiency, unspecified: Secondary | ICD-10-CM | POA: Diagnosis not present

## 2018-05-08 DIAGNOSIS — R6882 Decreased libido: Secondary | ICD-10-CM | POA: Diagnosis not present

## 2018-05-08 DIAGNOSIS — E663 Overweight: Secondary | ICD-10-CM

## 2018-05-08 MED ORDER — CYANOCOBALAMIN 1000 MCG/ML IJ SOLN: 1000.0000 ug | INTRAMUSCULAR | 1 refills | Status: DC

## 2018-05-08 MED ORDER — PHENTERMINE HCL 37.5 MG PO TABS
37.5000 mg | ORAL_TABLET | Freq: Every morning | ORAL | 2 refills | Status: DC
Start: 2018-05-08 — End: 2019-02-11

## 2018-05-08 NOTE — Progress Notes (Signed)
Subjective:   Toni Vang is a 52 y.o. G68P2013 Caucasian female here for a routine well-woman exam.  No LMP recorded. Patient has had a hysterectomy.    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  Past Medical History Past Medical History:  Diagnosis Date  . Acid reflux   . Arthritis   . 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 Hypertension   . Personal history of chemotherapy   . Personal history of radiation therapy     Past Surgical History Past Surgical History:  Procedure Laterality Date  . ABDOMINAL HYSTERECTOMY    . BLADDER SURGERY    . LAPAROSCOPY    . NASAL SINUS SURGERY      Gynecologic History [id number]  No LMP recorded. Patient has had a hysterectomy. Contraception: post menopausal status Last Pap: 08/2015. Results were: normal Last mammogram: 04/2017. Results were: normal  Obstetric History OB History  Gravida Para Term Preterm AB Living  SAB TAB Ectopic Multiple Live Births  1       3    # Outcome Date GA Lbr Len/2nd Weight Sex Delivery Anes PTL Lv  4 Term 2002    F Vag-Spont   LIV  3 Gravida 1999    M Vag-Spont   LIV  2 Term 1992    M Vag-Spont   LIV  1 SAB             Current Medications Current Outpatient Medications on File Prior to Visit  Medication Sig  Dispense Refill  . cetirizine (ZYRTEC) 10 MG tablet Take 10 mg by mouth daily.    . ranitidine (ZANTAC) 150 MG tablet Take 150 mg by mouth 2 (two) times daily.    Marland Kitchen omeprazole (PRILOSEC) 20 MG capsule Take 1 capsule (20 mg total) by mouth daily with a full glass of water 30 minutes before breakfast. (Patient not taking: Reported on 05/08/2018) 30 capsule 0  . phentermine (ADIPEX-P) 37.5 MG tablet TAKE 1 TABLET BY MOUTH EVERY MORNING (Patient not taking: Reported on 05/08/2018) 30 tablet 2   No current facility-administered medications on file prior to visit.     Review of Systems Patient denies any headaches, blurred vision, shortness of breath, chest pain, abdominal pain, problems with bowel movements, urination, or intercourse.  Objective:  BP 127/84   Pulse 87   Ht  (1.727 m)   Wt 182 lb 6.4 oz (82.7 kg)   BMI 27.73 kg/m  Physical Exam  General:  Well developed, well nourished, no acute distress. She is alert and oriented x3. Skin:  Warm and dry Neck:  Midline trachea, no thyromegaly or nodules Cardiovascular: Regular rate and rhythm, no murmur heard Lungs:  Effort normal, all lung fields clear to auscultation bilaterally Breasts:  No dominant palpable mass, retraction, or nipple discharge Abdomen:  Soft, non tender, no hepatosplenomegaly or masses Pelvic:  External genitalia is normal in appearance.  The vagina is normal in appearance. The cervix is bulbous, no CMT.  Thin prep pap is not done. Uterus is felt to be normal size, shape, and contour.  No adnexal masses or tenderness noted. Extremities:  No swelling or varicosities noted Psych:  She has a normal mood and affect UA negative  Assessment:   Healthy well-woman exam Bladder pressure Decreased sex drive Bruising easily Vit D deficiency   Plan:  Labs obtained- will follow up accordingly B12 injection given today F/U 1 year for AE, or sooner if needed Mammogram ordered or sooner if problems Colonoscopy due or  sooner if problems  Anisah Kuck Suzan Nailer, CNM

## 2018-05-10 LAB — ESTRADIOL: ESTRADIOL: 12 pg/mL

## 2018-05-10 LAB — COMPREHENSIVE METABOLIC PANEL
ALT: 13 IU/L (ref 0–32)
AST: 13 IU/L (ref 0–40)
Albumin/Globulin Ratio: 1.8 (ref 1.2–2.2)
Albumin: 4.6 g/dL (ref 3.5–5.5)
Alkaline Phosphatase: 75 IU/L (ref 39–117)
BUN/Creatinine Ratio: 16 (ref 9–23)
BUN: 12 mg/dL (ref 6–24)
Bilirubin Total: 0.2 mg/dL (ref 0.0–1.2)
CALCIUM: 9.3 mg/dL (ref 8.7–10.2)
CO2: 23 mmol/L (ref 20–29)
CREATININE: 0.76 mg/dL (ref 0.57–1.00)
Chloride: 103 mmol/L (ref 96–106)
GFR, EST AFRICAN AMERICAN: 105 mL/min/{1.73_m2} (ref >59)
GFR, EST NON AFRICAN AMERICAN: 91 mL/min/{1.73_m2} (ref >59)
GLUCOSE: 80 mg/dL (ref 65–99)
Globulin, Total: 2.5 g/dL (ref 1.5–4.5)
Potassium: 3.9 mmol/L (ref 3.5–5.2)
Sodium: 140 mmol/L (ref 134–144)
TOTAL PROTEIN: 7.1 g/dL (ref 6.0–8.5)

## 2018-05-10 LAB — LIPID PANEL
CHOL/HDL RATIO: 3.1 ratio (ref 0.0–4.4)
Cholesterol, Total: 172 mg/dL (ref 100–199)
HDL: 55 mg/dL (ref >39)
LDL Calculated: 93 mg/dL (ref 0–99)
TRIGLYCERIDES: 119 mg/dL (ref 0–149)
VLDL Cholesterol Cal: 24 mg/dL (ref 5–40)

## 2018-05-10 LAB — PROGESTERONE

## 2018-05-10 LAB — CBC
Hematocrit: 35.9 % (ref 34.0–46.6)
Hemoglobin: 12.2 g/dL (ref 11.1–15.9)
MCH: 28.7 pg (ref 26.6–33.0)
MCHC: 34 g/dL (ref 31.5–35.7)
MCV: 85 fL (ref 79–97)
PLATELETS: 331 10*3/uL (ref 150–450)
RBC: 4.25 x10E6/uL (ref 3.77–5.28)
RDW: 13.9 % (ref 12.3–15.4)
WBC: 8.9 10*3/uL (ref 3.4–10.8)

## 2018-05-10 LAB — B12 AND FOLATE PANEL
Folate: 12.3 ng/mL (ref >3.0)
Vitamin B-12: 440 pg/mL (ref 232–1245)

## 2018-05-10 LAB — TESTOSTERONE, FREE, TOTAL, SHBG
Sex Hormone Binding: 37.4 nmol/L (ref 17.3–125.0)
TESTOSTERONE FREE: 1.6 pg/mL (ref 0.0–4.2)
TESTOSTERONE: 17 ng/dL (ref 3–41)

## 2018-05-10 LAB — VITAMIN D 25 HYDROXY (VIT D DEFICIENCY, FRACTURES): VIT D 25 HYDROXY: 33.8 ng/mL (ref 30.0–100.0)

## 2018-05-10 LAB — THYROID PANEL WITH TSH
Free Thyroxine Index: 1.6 (ref 1.2–4.9)
T3 UPTAKE RATIO: 23 % — AB (ref 24–39)
T4 TOTAL: 7.1 ug/dL (ref 4.5–12.0)
TSH: 0.953 u[IU]/mL (ref 0.450–4.500)

## 2018-05-10 LAB — DHEA-SULFATE: DHEA-SO4: 155.8 ug/dL (ref 41.2–243.7)

## 2018-05-10 LAB — FERRITIN: FERRITIN: 53 ng/mL (ref 15–150)

## 2018-08-25 ENCOUNTER — Other Ambulatory Visit: Payer: Self-pay | Admitting: Obstetrics and Gynecology

## 2018-08-25 DIAGNOSIS — Z1231 Encounter for screening mammogram for malignant neoplasm of breast: Secondary | ICD-10-CM

## 2018-09-02 ENCOUNTER — Ambulatory Visit: Payer: Self-pay

## 2018-09-04 ENCOUNTER — Ambulatory Visit
Admission: RE | Admit: 2018-09-04 | Discharge: 2018-09-04 | Disposition: A | Discharge status: Home / Self Care | Payer: Managed Care, Other (non HMO) | Source: Ambulatory Visit | Attending: Obstetrics and Gynecology | Admitting: Obstetrics and Gynecology

## 2018-09-04 DIAGNOSIS — Z1231 Encounter for screening mammogram for malignant neoplasm of breast: Secondary | ICD-10-CM

## 2018-09-16 DIAGNOSIS — F419 Anxiety disorder, unspecified: Secondary | ICD-10-CM | POA: Insufficient documentation

## 2019-02-11 ENCOUNTER — Other Ambulatory Visit: Payer: Self-pay

## 2019-02-11 ENCOUNTER — Encounter: Payer: Self-pay | Admitting: Obstetrics and Gynecology

## 2019-02-11 ENCOUNTER — Ambulatory Visit (INDEPENDENT_AMBULATORY_CARE_PROVIDER_SITE_OTHER): Payer: Managed Care, Other (non HMO) | Admitting: Obstetrics and Gynecology

## 2019-02-11 ENCOUNTER — Encounter: Payer: Self-pay | Admitting: Adult Health

## 2019-02-11 ENCOUNTER — Ambulatory Visit: Payer: Managed Care, Other (non HMO) | Admitting: Adult Health

## 2019-02-11 VITALS — BP 138/78 | HR 78 | Temp 98.1°F | Resp 14

## 2019-02-11 VITALS — BP 158/88 | HR 98 | Ht 68.0 in | Wt 185.0 lb

## 2019-02-11 DIAGNOSIS — H66001 Acute suppurative otitis media without spontaneous rupture of ear drum, right ear: Secondary | ICD-10-CM | POA: Diagnosis not present

## 2019-02-11 DIAGNOSIS — F5222 Female sexual arousal disorder: Secondary | ICD-10-CM | POA: Diagnosis not present

## 2019-02-11 DIAGNOSIS — R300 Dysuria: Secondary | ICD-10-CM | POA: Diagnosis not present

## 2019-02-11 DIAGNOSIS — H60393 Other infective otitis externa, bilateral: Secondary | ICD-10-CM

## 2019-02-11 LAB — POCT URINALYSIS DIPSTICK
Bilirubin, UA: NEGATIVE
Blood, UA: NEGATIVE
Glucose, UA: NEGATIVE
KETONES UA: NEGATIVE
Nitrite, UA: NEGATIVE
Protein, UA: NEGATIVE
Spec Grav, UA: 1.015 (ref 1.010–1.025)
Urobilinogen, UA: 0.2 E.U./dL
pH, UA: 6.5 (ref 5.0–8.0)

## 2019-02-11 MED ORDER — AMOXICILLIN 875 MG PO TABS: 875.0000 mg | ORAL_TABLET | Freq: Two times a day (BID) | ORAL | 0 refills | Status: DC

## 2019-02-11 MED ORDER — FLIBANSERIN 100 MG PO TABS: 1.0000 | ORAL_TABLET | Freq: Every day | ORAL | 6 refills | Status: DC

## 2019-02-11 MED ORDER — NEOMYCIN-POLYMYXIN-HC 3.5-10000-1 OT SOLN: 4.0000 [drp] | Freq: Four times a day (QID) | OTIC | 1 refills | Status: DC

## 2019-02-11 NOTE — Progress Notes (Signed)
  Subjective:     Patient ID: Toni Vang, female   DOB: [birth year], 53 y.o.   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 Does a lot of heavy lifting. Also notices a vagina fullness when standing. Hasn't felt it before. Feels achy in general. Does note that she was seen in urgent care earlier today and diagnosed with otitis media.  Also wants to discuss absent sex drive, as it has gotten worse since last appointment. She is interested in try medication for it. She tried prometrium last year but didn't like the way it made her feel and wondered if there wasn't something else available.   Review of Systems  HENT: Positive for ear pain.   Genitourinary: Positive for vaginal pain.  All other systems reviewed and are negative.      Objective:   Physical Exam A&Ox4 Well groomed female in no distress Blood pressure (!) 158/88, pulse 98, height 5\' 8"  (1.727 m), weight 185 lb (83.9 kg). Urinalysis    Component Value Date/Time   BILIRUBINUR neg 02/11/2019 1554   PROTEINUR Negative 02/11/2019 1554   UROBILINOGEN 0.2 02/11/2019 1554   NITRITE neg 02/11/2019 1554   LEUKOCYTESUR Small (1+) (A) 02/11/2019 1554   Groin with no hernias or lymphadenopathy noted Pelvic exam: VULVA: normal appearing vulva with no masses, tenderness or lesions, VAGINA: normal appearing vagina with normal color and discharge, no lesions, PELVIC FLOOR EXAM: no cystocele, rectocele or prolapse noted, CERVIX: surgically absent, UTERUS: surgically absent, vaginal cuff well healed, ADNEXA: normal adnexa in size, nontender and no masses.    Assessment:     Pelvic pressure Decreased libido    Plan:     Counseled and reassured of normal findings. Feel like discomfort is still musculature in nature from heavy lifting. Counseled on current products available for female sexual decreased libido. Desires trial of Addyi and prescription sent in, counseled on s/e,  expected outcome and general use.    RTC as needed.  Christne Platts,CNM

## 2019-02-11 NOTE — Patient Instructions (Signed)
Otitis Media, Adult    Otitis media means that the middle ear is red and swollen (inflamed) and full of fluid. The condition usually goes away on its own.  Follow these instructions at home:  · Take over-the-counter and prescription medicines only as told by your doctor.  · If you were prescribed an antibiotic medicine, take it as told by your doctor. Do not stop taking the antibiotic even if you start to feel better.  · Keep all follow-up visits as told by your doctor. This is important.  Contact a doctor if:  · You have bleeding from your nose.  · There is a lump on your neck.  · You are not getting better in 5 days.  · You feel worse instead of better.  Get help right away if:  · You have pain that is not helped with medicine.  · You have swelling, redness, or pain around your ear.  · You get a stiff neck.  · You cannot move part of your face (paralyzed).  · You notice that the bone behind your ear hurts when you touch it.  · You get a very bad headache.  Summary  · Otitis media means that the middle ear is red, swollen, and full of fluid.  · This condition usually goes away on its own. In some cases, treatment may be needed.  · If you were prescribed an antibiotic medicine, take it as told by your doctor.  This information is not intended to replace advice given to you by your health care provider. Make sure you discuss any questions you have with your health care provider.  Document Released: 05/14/2008 Document Revised: 12/17/2016 Document Reviewed: 12/17/2016  Elsevier Interactive Patient Education © 2019 Elsevier Inc.        Otitis Externa    Otitis externa is an infection of the outer ear canal. The outer ear canal is the area between the outside of the ear and the eardrum. Otitis externa is sometimes called swimmer's ear.  What are the causes?  Common causes of this condition include:  · Swimming in dirty water.  · Moisture in the ear.  · An injury to the inside of the ear.  · An object stuck in the  ear.  · A cut or scrape on the outside of the ear.  What increases the risk?  You are more likely to get this condition if you go swimming often.  What are the signs or symptoms?  · Itching in the ear. This is often the first symptom.  · Swelling of the ear.  · Redness in the ear.  · Ear pain. The pain may get worse when you pull on your ear.  · Pus coming from the ear.  How is this treated?  This condition may be treated with:  · Antibiotic ear drops. These are often given for 10-14 days.  · Medicines to reduce itching and swelling.  Follow these instructions at home:  · If you were given antibiotic ear drops, use them as told by your doctor. Do not stop using them even if your condition gets better.  · Take over-the-counter and prescription medicines only as told by your doctor.  · Avoid getting water in your ears as told by your doctor. You may be told to avoid swimming or water sports for a few days.  · Keep all follow-up visits as told by your doctor. This is important.  How is this prevented?  · Keep your   called chlorine. Contact a doctor if:  You have a fever.  Your ear is still red, swollen, or painful after 3 days.  You still have pus coming from your ear after 3 days.  Your redness, swelling, or pain gets worse.  You have a really bad headache.  You have redness, swelling, pain, or tenderness behind your ear. Summary  Otitis externa is an infection of the outer ear canal.  Symptoms include pain, redness, and swelling of the ear.  If you were given antibiotic ear drops, use them as told by your doctor. Do not stop using them even if your condition gets better.  Try not to scratch or put things in your  ear. This information is not intended to replace advice given to you by your health care provider. Make sure you discuss any questions you have with your health care provider. Document Released: 05/14/2008 Document Revised: 05/02/2018 Document Reviewed: 05/02/2018 Elsevier Interactive Patient Education  2019 Elsevier Inc. Amoxicillin; Clavulanic Acid tablets What is this medicine? AMOXICILLIN; CLAVULANIC ACID (a mox i SIL in; KLAV yoo lan ic AS id) is a penicillin antibiotic. It is used to treat certain kinds of bacterial infections. It will not work for colds, flu, or other viral infections. This medicine may be used for other purposes; ask your health care provider or pharmacist if you have questions. COMMON BRAND NAME(S): Augmentin What should I tell my health care provider before I take this medicine? They need to know if you have any of these conditions: -bowel disease, like colitis -kidney disease -liver disease -mononucleosis -an unusual or allergic reaction to amoxicillin, penicillin, cephalosporin, other antibiotics, clavulanic acid, other medicines, foods, dyes, or preservatives -pregnant or trying to get pregnant -breast-feeding How should I use this medicine? Take this medicine by mouth with a full glass of water. Follow the directions on the prescription label. Take at the start of a meal. Do not crush or chew. If the tablet has a score line, you may cut it in half at the score line for easier swallowing. Take your medicine at regular intervals. Do not take your medicine more often than directed. Take all of your medicine as directed even if you think you are better. Do not skip doses or stop your medicine early. Talk to your pediatrician regarding the use of this medicine in children. Special care may be needed. Overdosage: If you think you have taken too much of this medicine contact a poison control center or emergency room at once. NOTE: This medicine is only for you. Do not  share this medicine with others. What if I miss a dose? If you miss a dose, take it as soon as you can. If it is almost time for your next dose, take only that dose. Do not take double or extra doses. What may interact with this medicine? -allopurinol -anticoagulants -birth control pills -methotrexate -probenecid This list may not describe all possible interactions. Give your health care provider a list of all the medicines, herbs, non-prescription drugs, or dietary supplements you use. Also tell them if you smoke, drink alcohol, or use illegal drugs. Some items may interact with your medicine. What should I watch for while using this medicine? Tell your doctor or health care professional if your symptoms do not improve. Do not treat diarrhea with over the counter products. Contact your doctor if you have diarrhea that lasts more than 2 days or if it is severe and watery. If you have diabetes, you may get a  false-positive result for sugar in your urine. Check with your doctor or health care professional. Birth control pills may not work properly while you are taking this medicine. Talk to your doctor about using an extra method of birth control. What side effects may I notice from receiving this medicine? Side effects that you should report to your doctor or health care professional as soon as possible: -allergic reactions like skin rash, itching or hives, swelling of the face, lips, or tongue -breathing problems -dark urine -fever or chills, sore throat -redness, blistering, peeling or loosening of the skin, including inside the mouth -seizures -trouble passing urine or change in the amount of urine -unusual bleeding, bruising -unusually weak or tired -white patches or sores in the mouth or throat Side effects that usually do not require medical attention (report to your doctor or health care professional if they continue or are bothersome): -diarrhea -dizziness -headache -nausea,  vomiting -stomach upset -vaginal or anal irritation This list may not describe all possible side effects. Call your doctor for medical advice about side effects. You may report side effects to FDA at 1-800-FDA-1088. Where should I keep my medicine? Keep out of the reach of children. Store at room temperature below 25 degrees C (77 degrees F). Keep container tightly closed. Throw away any unused medicine after the expiration date. NOTE: This sheet is a summary. It may not cover all possible information. If you have questions about this medicine, talk to your doctor, pharmacist, or health care provider.  2019 Elsevier/Gold Standard (2008-02-19 12:04:30)

## 2019-02-11 NOTE — Progress Notes (Signed)
[facility]  Subjective:     Patient ID: Toni Vang, female   DOB: Jan 20, 1966, 53 y.o.   MRN: [id number]  HPI   Blood pressure 138/78, pulse 78, temperature 98.1 F (36.7 C), temperature source Oral, resp. rate 14, SpO2 99 %. Patient is a 53 year old female in no acute distress who comes to the clinic with complaints of painful swellling near ear  X 2 days.No pain in ear. [place] in right ear.   Denies previous ear infections.  She does put her head under water to rinse hair daily in tub.  She does report she has a very high pain tolerance.  She has not felt well in the past 3 days. Fatigue.    Patient  denies any fever, body aches,chills, rash, chest pain, shortness of breath, nausea, vomiting, or diarrhea.     No LMP recorded. Patient has had a hysterectomy.     Allergies  Allergen Reactions  . Codeine Sulfate Nausea Only  . Erythromycin Nausea Only  . Latex        Review of Systems  Constitutional: Positive for fatigue. Negative for activity change, appetite change, chills, diaphoresis, fever and unexpected weight change.  HENT: Positive for rhinorrhea. Negative for congestion, dental problem, drooling, ear discharge, ear pain, facial swelling, hearing loss, mouth sores, nosebleeds, postnasal drip, sinus pressure, sinus pain, sneezing, sore throat, tinnitus, trouble swallowing and voice change.   Respiratory: Negative.   Cardiovascular: Negative.   Gastrointestinal: Negative.   Genitourinary: Negative.   Musculoskeletal: Negative.   Neurological: Negative.   Hematological: Positive for adenopathy. Does not bruise/bleed easily.  Psychiatric/Behavioral: Negative.        Objective:   Physical Exam Vitals signs reviewed.  Constitutional:      General: She is not in acute distress.    Appearance: Normal appearance. She is well-developed, well-groomed and normal weight. She is not ill-appearing, toxic-appearing or  diaphoretic.  HENT:     Head: Normocephalic and atraumatic.     Jaw: There is normal jaw occlusion.     Salivary Glands: Right salivary gland is not diffusely enlarged or tender. Left salivary gland is not diffusely enlarged or tender.     Right Ear: External ear normal. Swelling present. No tenderness. A middle ear effusion is present. There is no impacted cerumen. Tympanic membrane is erythematous (green yellow fluid ) and bulging. Tympanic membrane is not retracted.     Left Ear: External ear normal. Swelling present. No tenderness. A middle ear effusion is present. There is no impacted cerumen. Tympanic membrane is not erythematous or bulging.     Nose: Rhinorrhea present. No congestion.     Mouth/Throat:     Mouth: Mucous membranes are moist.     Pharynx: No oropharyngeal exudate or posterior oropharyngeal erythema.  Eyes:     General: No scleral icterus.       Right eye: No discharge.        Left eye: No discharge.     Extraocular Movements: Extraocular movements intact.     Conjunctiva/sclera: Conjunctivae normal.     Pupils: Pupils are equal, round, and reactive to light.  Neck:     Musculoskeletal: Normal range of motion and neck supple. No neck rigidity or muscular tenderness.     Vascular: No carotid bruit.  Cardiovascular:     Rate and Rhythm: Normal rate and regular rhythm.     Heart sounds: Normal heart sounds. No murmur. No friction rub.  No gallop.   Pulmonary:     Effort: Pulmonary effort is normal. No respiratory distress.     Breath sounds: Normal breath sounds. No stridor. No wheezing, rhonchi or rales.  Chest:     Chest wall: No tenderness.  Abdominal:     Palpations: Abdomen is soft.  Musculoskeletal: Normal range of motion.  Lymphadenopathy:     Head:     Right side of head: Preauricular adenopathy present. No submental, submandibular, tonsillar, posterior auricular or occipital adenopathy.     Left side of head: No submental, submandibular, tonsillar,  preauricular, posterior auricular or occipital adenopathy.     Cervical: No cervical adenopathy.     Right cervical: No superficial, deep or posterior cervical adenopathy.    Left cervical: No superficial, deep or posterior cervical adenopathy.  Skin:    General: Skin is warm and dry.     Capillary Refill: Capillary refill takes less than 2 seconds.  Neurological:     Mental Status: She is alert and oriented to person, place, and time.     Gait: Gait normal.  Psychiatric:        Attention and Perception: Attention normal.        Mood and Affect: Mood normal.        Speech: Speech normal.        Behavior: Behavior normal. Behavior is cooperative.        Thought Content: Thought content normal.        Cognition and Memory: Cognition normal.        Judgment: Judgment normal.        Assessment:     Infective otitis externa of both ears  Non-recurrent acute suppurative otitis media of right ear without spontaneous rupture of tympanic membrane      Plan:     Toni Vang was seen today for ear pain.  Diagnoses and all orders for this visit:  Infective otitis externa of both ears  Non-recurrent acute suppurative otitis media of right ear without spontaneous rupture of tympanic membrane  Other orders -     amoxicillin (AMOXIL) 875 MG tablet; Take 1 tablet (875 mg total) by mouth 2 (two) times daily. -     neomycin-polymyxin-hydrocortisone (CORTISPORIN) OTIC solution; Place 4 drops into both ears 4 (four) times daily.  Keep ears dry- do not submerge in water.   Advised patient call the office or your primary care doctor for an appointment if no improvement within 72 hours or if any symptoms change or worsen at any time  Advised ER or urgent Care if after hours or on weekend. Call 911 for emergency symptoms at any time.Patinet verbalized understanding of all instructions given/reviewed and treatment plan and has no further questions or concerns at this time.   Gave and reviewed After  Visit Summary(AVS) with patient. Patient is advised to read the after visit summary as well and let the provider know if any question, concerns or clarifications are needed.   Patient verbalized understanding of all instructions given and denies any further questions at this time.

## 2019-03-05 ENCOUNTER — Other Ambulatory Visit: Payer: Self-pay | Admitting: Obstetrics and Gynecology

## 2019-03-05 MED ORDER — PROGESTERONE MICRONIZED 100 MG PO CAPS: 100.0000 mg | ORAL_CAPSULE | Freq: Every day | ORAL | 6 refills | Status: DC

## 2019-04-01 NOTE — Addendum Note (Signed)
Addended by: Berniece Pap on: 04/01/2019 02:09 PM   Modules accepted: Level of Service

## 2019-08-24 DIAGNOSIS — K219 Gastro-esophageal reflux disease without esophagitis: Secondary | ICD-10-CM | POA: Insufficient documentation

## 2019-08-28 ENCOUNTER — Ambulatory Visit: Payer: Managed Care, Other (non HMO) | Admitting: Cardiology

## 2019-09-07 LAB — BASIC METABOLIC PANEL
BUN: 11 (ref 4–21)
CO2: 29 — AB (ref 13–22)
Chloride: 103 (ref 99–108)
Creatinine: 0.6 (ref 0.5–1.1)
Glucose: 87
Potassium: 4.2 (ref 3.4–5.3)
Sodium: 138 (ref 137–147)

## 2020-02-04 ENCOUNTER — Ambulatory Visit: Payer: Managed Care, Other (non HMO) | Admitting: Emergency Medicine

## 2020-02-04 ENCOUNTER — Other Ambulatory Visit: Payer: Self-pay

## 2020-02-04 VITALS — BP 160/90 | HR 82 | Temp 98.0°F | Resp 18 | Ht 67.0 in | Wt 198.0 lb

## 2020-02-04 DIAGNOSIS — R300 Dysuria: Secondary | ICD-10-CM | POA: Diagnosis not present

## 2020-02-04 MED ORDER — PHENAZOPYRIDINE HCL 200 MG PO TABS: 200.0000 mg | ORAL_TABLET | Freq: Three times a day (TID) | ORAL | 0 refills | Status: DC | PRN

## 2020-02-04 NOTE — Progress Notes (Signed)
S:  54 year old female with urgency, dysuria x 3 days.  Also has had occasional right sided groin pain.  No hematuria to her knowledge.  She denies any fever, chills, nausea, vomiting or history of kidney stones.  Pt has had a hysterectomy.  She has been taking Azo OTC with relief but "it wears off".    O:  Dipstix results show large amount of blood without nitrites or leuks.     No CVA tenderness bilaterally.  Abdomen soft, with minimal tenderness right groin area.  No suprapubic tenderness.    A:  Dysuria  P:  Patient will take pyridium 200 mg prn and increase fluids. Have urine re-check in one week.  If continued sx or still showing blood she will call to follow up with urology.  If any worsening of her sx as we discussed she will come to ED over the weekend for further tests.

## 2020-03-28 ENCOUNTER — Other Ambulatory Visit: Payer: Self-pay

## 2020-03-28 ENCOUNTER — Ambulatory Visit: Payer: Managed Care, Other (non HMO) | Admitting: Certified Nurse Midwife

## 2020-03-28 ENCOUNTER — Encounter: Payer: Self-pay | Admitting: Certified Nurse Midwife

## 2020-03-28 VITALS — BP 156/84 | HR 85 | Ht 67.0 in | Wt 196.4 lb

## 2020-03-28 DIAGNOSIS — R300 Dysuria: Secondary | ICD-10-CM

## 2020-03-28 DIAGNOSIS — Z9889 Other specified postprocedural states: Secondary | ICD-10-CM

## 2020-03-28 LAB — POCT URINALYSIS DIPSTICK
Bilirubin, UA: NEGATIVE
Glucose, UA: NEGATIVE
Ketones, UA: NEGATIVE
Leukocytes, UA: NEGATIVE
Nitrite, UA: NEGATIVE
Protein, UA: NEGATIVE
Spec Grav, UA: 1.02 (ref 1.010–1.025)
Urobilinogen, UA: 0.2 E.U./dL
pH, UA: 5 (ref 5.0–8.0)

## 2020-03-28 MED ORDER — ESCITALOPRAM OXALATE 10 MG PO TABS: 10.0000 mg | ORAL_TABLET | Freq: Every day | ORAL | 3 refills | Status: DC

## 2020-03-28 NOTE — Addendum Note (Signed)
Addended by: Brooke Dare on: 03/28/2020 09:20 AM   Modules accepted: Orders

## 2020-03-28 NOTE — Patient Instructions (Signed)
Menopause Menopause is the normal time of life when menstrual periods stop completely. It is usually confirmed by 12 months without a menstrual period. The transition to menopause (perimenopause) most often happens between the ages of 45 and 55. During perimenopause, hormone levels change in your body, which can cause symptoms and affect your health. Menopause may increase your risk for:  Loss of bone (osteoporosis), which causes bone breaks (fractures).  Depression.  Hardening and narrowing of the arteries (atherosclerosis), which can cause heart attacks and strokes. What are the causes? This condition is usually caused by a natural change in hormone levels that happens as you get older. The condition may also be caused by surgery to remove both ovaries (bilateral oophorectomy). What increases the risk? This condition is more likely to start at an earlier age if you have certain medical conditions or treatments, including:  A tumor of the pituitary gland in the brain.  A disease that affects the ovaries and hormone production.  Radiation treatment for cancer.  Certain cancer treatments, such as chemotherapy or hormone (anti-estrogen) therapy.  Heavy smoking and excessive alcohol use.  Family history of early menopause. This condition is also more likely to develop earlier in women who are very thin. What are the signs or symptoms? Symptoms of this condition include:  Hot flashes.  Irregular menstrual periods.  Night sweats.  Changes in feelings about sex. This could be a decrease in sex drive or an increased comfort around your sexuality.  Vaginal dryness and thinning of the vaginal walls. This may cause painful intercourse.  Dryness of the skin and development of wrinkles.  Headaches.  Problems sleeping (insomnia).  Mood swings or irritability.  Memory problems.  Weight gain.  Hair growth on the face and chest.  Bladder infections or problems with urinating. How  is this diagnosed? This condition is diagnosed based on your medical history, a physical exam, your age, your menstrual history, and your symptoms. Hormone tests may also be done. How is this treated? In some cases, no treatment is needed. You and your health care provider should make a decision together about whether treatment is necessary. Treatment will be based on your individual condition and preferences. Treatment for this condition focuses on managing symptoms. Treatment may include:  Menopausal hormone therapy (MHT).  Medicines to treat specific symptoms or complications.  Acupuncture.  Vitamin or herbal supplements. Before starting treatment, make sure to let your health care provider know if you have a personal or family history of:  Heart disease.  Breast cancer.  Blood clots.  Diabetes.  Osteoporosis. Follow these instructions at home: Lifestyle  Do not use any products that contain nicotine or tobacco, such as cigarettes and e-cigarettes. If you need help quitting, ask your health care provider.  Get at least 30 minutes of physical activity on 5 or more days each week.  Avoid alcoholic and caffeinated beverages, as well as spicy foods. This may help prevent hot flashes.  Get 7-8 hours of sleep each night.  If you have hot flashes, try: ? Dressing in layers. ? Avoiding things that may trigger hot flashes, such as spicy food, warm places, or stress. ? Taking slow, deep breaths when a hot flash starts. ? Keeping a fan in your home and office.  Find ways to manage stress, such as deep breathing, meditation, or journaling.  Consider going to group therapy with other women who are having menopause symptoms. Ask your health care provider about recommended group therapy meetings. Eating and   drinking  Eat a healthy, balanced diet that contains whole grains, lean protein, low-fat dairy, and plenty of fruits and vegetables.  Your health care provider may recommend  adding more soy to your diet. Foods that contain soy include tofu, tempeh, and soy milk.  Eat plenty of foods that contain calcium and vitamin D for bone health. Items that are rich in calcium include low-fat milk, yogurt, beans, almonds, sardines, broccoli, and kale. Medicines  Take over-the-counter and prescription medicines only as told by your health care provider.  Talk with your health care provider before starting any herbal supplements. If prescribed, take vitamins and supplements as told by your health care provider. These may include: ? Calcium. Women age 51 and older should get 1,200 mg (milligrams) of calcium every day. ? Vitamin D. Women need 600-800 International Units of vitamin D each day. ? Vitamins B12 and B6. Aim for 50 micrograms of B12 and 1.5 mg of B6 each day. General instructions  Keep track of your menstrual periods, including: ? When they occur. ? How heavy they are and how long they last. ? How much time passes between periods.  Keep track of your symptoms, noting when they start, how often you have them, and how long they last.  Use vaginal lubricants or moisturizers to help with vaginal dryness and improve comfort during sex.  Keep all follow-up visits as told by your health care provider. This is important. This includes any group therapy or counseling. Contact a health care provider if:  You are still having menstrual periods after age 55.  You have pain during sex.  You have not had a period for 12 months and you develop vaginal bleeding. Get help right away if:  You have: ? Severe depression. ? Excessive vaginal bleeding. ? Pain when you urinate. ? A fast or irregular heart beat (palpitations). ? Severe headaches. ? Abdomen (abdominal) pain or severe indigestion.  You fell and you think you have a broken bone.  You develop leg or chest pain.  You develop vision problems.  You feel a lump in your breast. Summary  Menopause is the normal  time of life when menstrual periods stop completely. It is usually confirmed by 12 months without a menstrual period.  The transition to menopause (perimenopause) most often happens between the ages of 45 and 55.  Symptoms can be managed through medicines, lifestyle changes, and complementary therapies such as acupuncture.  Eat a balanced diet that is rich in nutrients to promote bone health and heart health and to manage symptoms during menopause. This information is not intended to replace advice given to you by your health care provider. Make sure you discuss any questions you have with your health care provider. Document Revised: 11/08/2017 Document Reviewed: 12/29/2016 Elsevier Patient Education  2020 Elsevier Inc.  

## 2020-03-28 NOTE — Progress Notes (Signed)
GYN ENCOUNTER NOTE  Subjective:       Toni Vang is a 54 y.o. [phone] female is here for gynecologic evaluation of the following issues:  1. Burning with urination , frequency, incontinence , hot flashes, decreased sex drive, vaginal dryness.    Gynecologic History No LMP recorded. Patient has had a hysterectomy. Contraception: status post hysterectomy Last Pap: 08/17/2015. Results were: normal Last mammogram: 09/04/2018 . Results were: normal  Obstetric History OB History  Gravida Para Term Preterm AB Living  4 2 2   1 3   SAB TAB Ectopic Multiple Live Births  1       3    # Outcome Date GA Lbr Len/2nd Weight Sex Delivery Anes PTL Lv  4 Term 2002    F Vag-Spont   LIV  3 Gravida 1999    M Vag-Spont   LIV  2 Term 1992    M Vag-Spont   LIV  1 SAB             Past Medical History:  Diagnosis Date  . Acid reflux   . Arthritis   . 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 Hypertension   . Personal history of chemotherapy     Past Surgical History:  Procedure Laterality Date  . ABDOMINAL HYSTERECTOMY    . BLADDER SURGERY    . LAPAROSCOPY    . NASAL SINUS SURGERY      Current Outpatient Medications on File Prior to Visit  Medication Sig Dispense Refill  . amoxicillin (AMOXIL) 875 MG tablet Take 1 tablet (875 mg total) by mouth 2 (two) times daily. 20 tablet 0  . cetirizine (ZYRTEC) 10 MG tablet Take 10 mg by mouth daily.    . famotidine (PEPCID) 10 MG tablet Take by mouth.    . Flibanserin (ADDYI) 100 MG TABS Take 1 tablet by mouth daily. 30 tablet 6  . phenazopyridine (PYRIDIUM) 200 MG tablet Take 1 tablet (200 mg total) by mouth 3 (three) times daily as needed for pain. 15 tablet 0   No current facility-administered medications on file prior to visit.    Allergies  Allergen Reactions  . Codeine Itching  . Omeprazole Other (See Comments)    Altered mental status  . Codeine Sulfate Nausea Only  . Erythromycin Nausea Only  . Latex     Social History    Socioeconomic History  . Marital status: Married    Spouse name: Not on file  . Number of children: Not on file  . Years of education: Not on file  . Highest education level: Not on file  Occupational History  . Not on file  Tobacco Use  . Smoking status: Never Smoker  . Smokeless tobacco: Never Used  Substance and Sexual Activity  . Alcohol use: No  . Drug use: No  . Sexual activity: Yes    Birth control/protection: Surgical  Other Topics Concern  . Not on file  Social History Narrative  . Not on file   Social Determinants of Health   Financial Resource Strain:   . Difficulty of Paying Living Expenses:   Food Insecurity:   . Worried About 12-28-1994 in the Last Year:   . Programme researcher, broadcasting/film/video in the Last Year:   Transportation Needs:   . Barista (Medical):   Freight forwarder Lack of Transportation (Non-Medical):   Physical Activity:   . Days of Exercise per Week:   . Minutes of Exercise  per Session:   Stress:   . Feeling of Stress :   Social Connections:   . Frequency of Communication with Friends and Family:   . Frequency of Social Gatherings with Friends and Family:   . Attends Religious Services:   . Active Member of Clubs or Organizations:   . Attends Archivist Meetings:   Marland Kitchen Marital Status:   Intimate Partner Violence:   . Fear of Current or Ex-Partner:   . Emotionally Abused:   Marland Kitchen Physically Abused:   . Sexually Abused:     Family History  Problem Relation Age of Onset  . Diabetes Mother   . Hypertension Mother   . Breast cancer Mother 5  . Hypertension Father   . Cancer Maternal Aunt        ovarian  . Diabetes Maternal Grandmother   . Diabetes Maternal Grandfather     The following portions of the patient's history were reviewed and updated as appropriate: allergies, current medications, past family history, past medical history, past social history, past surgical history and problem list.  Review of Systems Review of Systems -  Negative except as mentioned in HPI Review of Systems - General ROS: negative for - chills, fatigue, fever, hot flashes, malaise or night sweats Hematological and Lymphatic ROS: negative for - bleeding problems or swollen lymph nodes Gastrointestinal ROS: negative for - abdominal pain, blood in stools, change in bowel habits and nausea/vomiting Musculoskeletal ROS: negative for - joint pain, muscle pain or muscular weakness Genito-Urinary ROS: negative for - change in menstrual cycle, dysmenorrhea, dyspareunia, dysuria, genital discharge, genital ulcers, hematuria, incontinence, irregular/heavy menses, nocturia or pelvic painjj  Objective:   There were no vitals taken for this visit. CONSTITUTIONAL: Well-developed, well-nourished female in no acute distress.  HENT:  Normocephalic, atraumatic.  NECK: Normal range of motion, supple, no masses.  Normal thyroid.  SKIN: Skin is warm and dry. No rash noted. Not diaphoretic. No erythema. No pallor. [place]: Alert and oriented to person, place, and time. PSYCHIATRIC: Normal mood and affect. Normal behavior. Normal judgment and thought content. CARDIOVASCULAR:Not Examined RESPIRATORY: Not Examined BREASTS: Not Examined ABDOMEN: Soft, non distended; Non tender.  No Organomegaly. PELVIC:Declines, denies potential of infections.  MUSCULOSKELETAL: Normal range of motion. No tenderness.  No cyanosis, clubbing, or edema.     Assessment:   Vaginal dryness Decreased sex drive Urgency incontinence Dyspareunia    Plan:   Discussed with pt that due to history of uterine cancer not recommended to do Hormone replacement therapy. Discussed Common symptoms of menopause and self help measures including , herbal supplement, soy, dietary changes, exercise. Discussed use of Vaginal moisturize and lubrications for intercourse. Samples given. Discussed use of SSRI to help manage hot flashes. Pt states when she had her hysterectomy she had her bladder tacked  and she feels like it has dropped contributing her her bladder symptoms and frequent UTI's. Referral placed for urology follow up. She states she was treated for UTI recently and was given 5 days of antibiotics was feeling better but once she stopped antibiotics the symptoms returned. She feels like the UTI was not treated completed. Urine culture collected. Information sheet given on shelf help for menopausal symptoms. Discussed use of medication Addyi she declines at this time due to cost. Will follow up with lab results. Follow up for annual or prn.   Philip Aspen, CNM

## 2020-03-30 ENCOUNTER — Telehealth: Payer: Self-pay

## 2020-03-30 LAB — URINE CULTURE

## 2020-03-30 NOTE — Telephone Encounter (Signed)
mychart message sent to patient re: culture results

## 2020-04-08 ENCOUNTER — Ambulatory Visit: Payer: Managed Care, Other (non HMO) | Admitting: Urology

## 2020-04-22 ENCOUNTER — Ambulatory Visit: Payer: Managed Care, Other (non HMO) | Admitting: Urology

## 2020-05-20 ENCOUNTER — Ambulatory Visit: Payer: Managed Care, Other (non HMO) | Admitting: Urology

## 2020-06-03 ENCOUNTER — Other Ambulatory Visit: Payer: Self-pay

## 2020-06-03 ENCOUNTER — Encounter: Payer: Self-pay | Admitting: Emergency Medicine

## 2020-06-03 ENCOUNTER — Ambulatory Visit
Admission: EM | Admit: 2020-06-03 | Discharge: 2020-06-03 | Disposition: A | Discharge status: Home / Self Care | Payer: Managed Care, Other (non HMO) | Attending: Family Medicine | Admitting: Family Medicine

## 2020-06-03 DIAGNOSIS — N3000 Acute cystitis without hematuria: Secondary | ICD-10-CM

## 2020-06-03 LAB — URINALYSIS, COMPLETE (UACMP) WITH MICROSCOPIC
Bilirubin Urine: NEGATIVE
Glucose, UA: NEGATIVE mg/dL
Ketones, ur: NEGATIVE mg/dL
Nitrite: NEGATIVE
Protein, ur: NEGATIVE mg/dL
Specific Gravity, Urine: 1.01 (ref 1.005–1.030)
pH: 5.5 (ref 5.0–8.0)

## 2020-06-03 MED ORDER — CEPHALEXIN 500 MG PO CAPS: 500.0000 mg | ORAL_CAPSULE | Freq: Two times a day (BID) | ORAL | 0 refills | Status: DC

## 2020-06-03 NOTE — ED Provider Notes (Signed)
[facility]    CSN: [id number] Arrival date & time: 06/03/20  1527  History   Chief Complaint Chief Complaint  Patient presents with  . Dysuria  . Urinary Urgency   HPI  54 year old female presents with the above complaints.  Patient is concerned she has UTI.  She reports dysuria and urinary frequency/urgency for the past 3 days.  No medications or interventions tried.  No fever.  Patient reports pain in the bladder area.  9/10 in severity.  No other associated symptoms.  No other complaints.  Past Medical History:  Diagnosis Date  . Acid reflux   . Arthritis   . 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 Hypertension   . Personal history of chemotherapy     Patient Active Problem List   Diagnosis Date Noted  . Anxiety 09/16/2018  . Vitamin D deficiency 08/22/2015  . Low serum progesterone 08/22/2015    Past Surgical History:  Procedure Laterality Date  . ABDOMINAL HYSTERECTOMY    . BLADDER SURGERY    . LAPAROSCOPY    . NASAL SINUS SURGERY      OB History    Gravida  4   Para  3   Term  3   Preterm      AB  1   Living  3     SAB  1   TAB      Ectopic      Multiple      Live Births  3            Home Medications    Prior to Admission medications   Medication Sig Start Date End Date Taking? Authorizing Provider  cetirizine (ZYRTEC) 10 MG tablet Take 10 mg by mouth daily.   Yes [provider]  omeprazole (PRILOSEC) 20 MG capsule Take 20 mg by mouth daily.   Yes [provider]  cephALEXin (KEFLEX) 500 MG capsule Take 1 capsule (500 mg total) by mouth 2 (two) times daily. 06/03/20   Tommie Sams, DO  famotidine (PEPCID) 10 MG tablet Take by mouth. 09/16/18 09/16/19  [provider]  escitalopram (LEXAPRO) 10 MG tablet Take 1 tablet (10 mg total) by mouth daily. 03/28/20 06/03/20  Doreene Burke, CNM    Family History Family History  Problem Relation Age of Onset  . Diabetes Mother   . Hypertension  Mother   . Breast cancer Mother 36  . Hypertension Father   . Cancer Maternal Aunt        ovarian  . Diabetes Maternal Grandmother   . Diabetes Maternal Grandfather     Social History Social History   Tobacco Use  . Smoking status: Never Smoker  . Smokeless tobacco: Never Used  Vaping Use  . Vaping Use: Never used  Substance Use Topics  . Alcohol use: No  . Drug use: No     Allergies   Codeine, Omeprazole, Codeine sulfate, Erythromycin, and Latex   Review of Systems Review of Systems  Constitutional: Positive for fever.  Genitourinary: Positive for dysuria, frequency and urgency.   Physical Exam Triage Vital Signs ED Triage Vitals  Enc Vitals Group     BP 06/03/20 1557 (!) 135/92     Pulse Rate 06/03/20 1557 81     Resp 06/03/20 1557 18     Temp 06/03/20 1557 98.2 F (36.8 C)     Temp Source 06/03/20 1557 Oral     SpO2 06/03/20 1557 100 %  Weight 06/03/20 1558 185 lb (83.9 kg)     Height 06/03/20 1558 5\' 8"  (1.727 m)     Head Circumference --      Peak Flow --      Pain Score 06/03/20 1557 9     Pain Loc --      Pain Edu? --      Excl. in [place]? --    Updated Vital Signs BP (!) 135/92 (BP Location: Left Arm)   Pulse 81   Temp 98.2 F (36.8 C) (Oral)   Resp 18   Ht 5\' 8"  (1.727 m)   Wt 83.9 kg   SpO2 100%   BMI 28.13 kg/m   Visual Acuity Right Eye Distance:   Left Eye Distance:   Bilateral Distance:    Right Eye Near:   Left Eye Near:    Bilateral Near:     Physical Exam Vitals and nursing note reviewed.  Constitutional:      General: She is not in acute distress.    Appearance: Normal appearance. She is not ill-appearing.  HENT:     Head: Normocephalic and atraumatic.  Cardiovascular:     Rate and Rhythm: Normal rate and regular rhythm.     Heart sounds: No murmur heard.   Pulmonary:     Effort: Pulmonary effort is normal.     Breath sounds: Normal breath sounds. No wheezing, rhonchi or rales.  Abdominal:     General: There is no  distension.     Palpations: Abdomen is soft.     Tenderness: There is no abdominal tenderness.  Neurological:     Mental Status: She is alert.  Psychiatric:        Mood and Affect: Mood normal.        Behavior: Behavior normal.    UC Treatments / Results  Labs (all labs ordered are listed, but only abnormal results are displayed) Labs Reviewed  URINALYSIS, COMPLETE (UACMP) WITH MICROSCOPIC - Abnormal; Notable for the following components:      Result Value   Color, Urine STRAW (*)    Hgb urine dipstick MODERATE (*)    Leukocytes,Ua TRACE (*)    Bacteria, UA RARE (*)    All other components within normal limits  URINE CULTURE    EKG   Radiology No results found.  Procedures Procedures (including critical care time)  Medications Ordered in UC Medications - No data to display  Initial Impression / Assessment and Plan / UC Course  I have reviewed the triage vital signs and the nursing notes.  Pertinent labs & imaging results that were available during my care of the patient were reviewed by me and considered in my medical decision making (see chart for details).    54 year old female presents with UTI.  Treated with Keflex.  Awaiting culture.  Final Clinical Impressions(s) / UC Diagnoses   Final diagnoses:  Acute cystitis without hematuria     Discharge Instructions     Antibiotic as prescribed.  Take care  Dr. Lacinda Axon   ED Prescriptions    Medication Sig Dispense Auth. Provider   cephALEXin (KEFLEX) 500 MG capsule Take 1 capsule (500 mg total) by mouth 2 (two) times daily. 14 capsule Thersa Salt G, DO     PDMP not reviewed this encounter.   Coral Spikes, [place] 06/03/20 2131

## 2020-06-03 NOTE — ED Triage Notes (Signed)
Patient in today c/o urinary urgency and dysuria x 3 days. Patient has not taken any OTC medications.

## 2020-06-03 NOTE — Discharge Instructions (Signed)
Antibiotic as prescribed.  Take care  Dr. Benett Swoyer  

## 2020-06-06 LAB — URINE CULTURE: Culture: 60000 — AB

## 2020-07-05 ENCOUNTER — Other Ambulatory Visit: Payer: Self-pay

## 2020-07-05 ENCOUNTER — Telehealth: Payer: Self-pay | Admitting: Certified Nurse Midwife

## 2020-07-05 ENCOUNTER — Ambulatory Visit
Admission: EM | Admit: 2020-07-05 | Discharge: 2020-07-05 | Disposition: A | Discharge status: Home / Self Care | Payer: Managed Care, Other (non HMO) | Attending: Emergency Medicine | Admitting: Emergency Medicine

## 2020-07-05 DIAGNOSIS — N3001 Acute cystitis with hematuria: Secondary | ICD-10-CM | POA: Insufficient documentation

## 2020-07-05 LAB — URINALYSIS, COMPLETE (UACMP) WITH MICROSCOPIC
Bilirubin Urine: NEGATIVE
Glucose, UA: NEGATIVE mg/dL
Ketones, ur: NEGATIVE mg/dL
Nitrite: NEGATIVE
Protein, ur: NEGATIVE mg/dL
Specific Gravity, Urine: 1.02 (ref 1.005–1.030)
pH: 6 (ref 5.0–8.0)

## 2020-07-05 MED ORDER — SULFAMETHOXAZOLE-TRIMETHOPRIM 800-160 MG PO TABS
1.0000 | ORAL_TABLET | Freq: Two times a day (BID) | ORAL | 0 refills | Status: AC
Start: 2020-07-05 — End: 2020-07-12

## 2020-07-05 MED ORDER — PHENAZOPYRIDINE HCL 200 MG PO TABS
200.0000 mg | ORAL_TABLET | Freq: Three times a day (TID) | ORAL | 0 refills | Status: DC | PRN
Start: 2020-07-05 — End: 2020-12-06

## 2020-07-05 NOTE — Telephone Encounter (Signed)
Pt called in and stated that she had a referral sent to [facility]. The pt states that she spoke to someone at the office and that the referral was put in for bladder issues, the pt wanted to be seen for a UTI issues. I told the pt to please hold while I took a look into it for her. I placed her on hold. Then the pt was put on hold and she hang up

## 2020-07-05 NOTE — ED Provider Notes (Signed)
HPI  SUBJECTIVE:  Toni Vang is a 54 y.o. female who presents with a "UTI.  She reports 2 days of dysuria, urinary urgency, frequency, cloudy or odorous urine.  No hematuria, abdominal, back, pelvic pain.  No nausea, vomiting, fevers, vaginal discharge, odor, vaginal itching, genital rash, abnormal vaginal bleeding.  She tried cranberry juice and Tylenol without improvement of symptoms.  Symptoms worse with urination.  No antipyretic in the past 6 hours.  She is in a long-term monogamous relationship with her husband who is asymptomatic.  STDs are not a concern today.  She states that she had identical symptoms  on 6/25 was seen here, and was given Keflex which she states did not really work.  Urine culture grew out 60,000 and E. coli and no sensitivities were done.  This is her third UTI in the past 6 months.  She states that Bactrim usually works for her.  She is requesting referral to urology.  She also has a history of vaginal yeast infections.  Past medical history negative for pyelonephritis, nephrolithiasis, diabetes, hypertension, STDs, trichomonas, BV, chronic kidney disease.  PMD: Dr. Greggory Stallion.    Past Medical History:  Diagnosis Date  . Acid reflux   . Arthritis   . 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 Hypertension   . Personal history of chemotherapy     Past Surgical History:  Procedure Laterality Date  . ABDOMINAL HYSTERECTOMY    . BLADDER SURGERY    . LAPAROSCOPY    . NASAL SINUS SURGERY      Family History  Problem Relation Age of Onset  . Diabetes Mother   . Hypertension Mother   . Breast cancer Mother 105  . Hypertension Father   . Cancer Maternal Aunt        ovarian  . Diabetes Maternal Grandmother   . Diabetes Maternal Grandfather     Social History   Tobacco Use  . Smoking status: Never Smoker  . Smokeless tobacco: Never Used  Vaping Use  . Vaping Use: Never used  Substance Use Topics  . Alcohol use: No  . Drug use: No    No current  facility-administered medications for this encounter.  Current Outpatient Medications:  .  cetirizine (ZYRTEC) 10 MG tablet, Take 10 mg by mouth daily., Disp: , Rfl:  .  famotidine (PEPCID) 10 MG tablet, Take by mouth., Disp: , Rfl:  .  omeprazole (PRILOSEC) 20 MG capsule, Take 20 mg by mouth daily., Disp: , Rfl:  .  phenazopyridine (PYRIDIUM) 200 MG tablet, Take 1 tablet (200 mg total) by mouth 3 (three) times daily as needed for pain., Disp: 6 tablet, Rfl: 0 .  sulfamethoxazole-trimethoprim (BACTRIM DS) 800-160 MG tablet, Take 1 tablet by mouth 2 (two) times daily for 7 days., Disp: 14 tablet, Rfl: 0  Allergies  Allergen Reactions  . Codeine Itching  . Omeprazole Other (See Comments)    Altered mental status  . Codeine Sulfate Nausea Only  . Erythromycin Nausea Only  . Latex      ROS  As noted in HPI.   Physical Exam  BP (!) 140/80 (BP Location: Right Arm)   Pulse 75   Temp 98.3 F (36.8 C) (Oral)   Resp 18   Ht 5\' 8"  (1.727 m)   Wt 83.9 kg   SpO2 99%   BMI 28.13 kg/m   Constitutional: Well developed, well nourished, no acute distress Eyes:  EOMI, conjunctiva normal bilaterally HENT: Normocephalic, atraumatic,mucus membranes  moist Respiratory: Normal inspiratory effort Cardiovascular: Normal rate GI: nondistended soft.  No suprapubic tenderness.  Mild right flank tenderness. Back: questionable right CVA tenderness skin: No rash, skin intact Musculoskeletal: no deformities Neurologic: Alert & oriented x 3, no focal neuro deficits Psychiatric: Speech and behavior appropriate   ED Course   Medications - No data to display  Orders Placed This Encounter  Procedures  . Urinalysis, Complete w Microscopic    Standing Status:   Standing    Number of Occurrences:   1  . Ambulatory referral to Urology    Referral Priority:   Routine    Referral Type:   Consultation    Referral Reason:   Specialty Services Required    Requested Specialty:   Urology    Number of  Visits Requested:   1    Results for orders placed or performed during the hospital encounter of 07/05/20 (from the past 24 hour(s))  Urinalysis, Complete w Microscopic     Status: Abnormal   Collection Time: 07/05/20  5:18 PM  Result Value Ref Range   Color, Urine YELLOW YELLOW   APPearance HAZY (A) CLEAR   Specific Gravity, Urine 1.020 1.005 - 1.030   pH 6.0 5.0 - 8.0   Glucose, UA NEGATIVE NEGATIVE mg/dL   Hgb urine dipstick MODERATE (A) NEGATIVE   Bilirubin Urine NEGATIVE NEGATIVE   Ketones, ur NEGATIVE NEGATIVE mg/dL   Protein, ur NEGATIVE NEGATIVE mg/dL   Nitrite NEGATIVE NEGATIVE   Leukocytes,Ua SMALL (A) NEGATIVE   Squamous Epithelial / LPF 0-5 0 - 5   WBC, UA 21-50 0 - 5 WBC/hpf   RBC / HPF 0-5 0 - 5 RBC/hpf   Bacteria, UA MANY (A) NONE SEEN   No results found.  ED Clinical Impression  1. Acute cystitis with hematuria      ED Assessment/Plan  Patient states Bactrim works for her.  UA, history consistent with UTI.  Concern for complicated UTI given right flank tenderness and questionable right CVA tenderness.  She has no systemic symptoms, no fevers, no antipyretic in the past 6 hours.  Home with Bactrim DS 1 tab twice daily for 7 days, Pyridium.  Placing referral to [facility] urology as this is the third UTI in the past 6 months.  Discussed labs,, MDM, treatment plan, and plan for follow-up with patient. Discussed sn/sx that should prompt return to the ED. patient agrees with plan.   Meds ordered this encounter  Medications  . sulfamethoxazole-trimethoprim (BACTRIM DS) 800-160 MG tablet    Sig: Take 1 tablet by mouth 2 (two) times daily for 7 days.    Dispense:  14 tablet    Refill:  0  . phenazopyridine (PYRIDIUM) 200 MG tablet    Sig: Take 1 tablet (200 mg total) by mouth 3 (three) times daily as needed for pain.    Dispense:  6 tablet    Refill:  0    *This clinic note was created using Scientist, clinical (histocompatibility and immunogenetics). Therefore, there may be occasional mistakes  despite careful proofreading.   ?    Domenick Gong, MD 07/06/20 [phone]

## 2020-07-05 NOTE — ED Triage Notes (Signed)
Patient complains of urinary urgency, frequency and burning x 3 days. Patient states that she had this last month and was given Keflex but didn't totally clear it. States that she would like to have Bactrim if possible.

## 2020-07-08 LAB — URINE CULTURE: Culture: >=100000 — AB

## 2020-09-27 ENCOUNTER — Other Ambulatory Visit: Payer: Self-pay | Admitting: Family Medicine

## 2020-09-27 DIAGNOSIS — Z1231 Encounter for screening mammogram for malignant neoplasm of breast: Secondary | ICD-10-CM

## 2020-11-14 ENCOUNTER — Other Ambulatory Visit: Payer: Self-pay | Admitting: Internal Medicine

## 2020-11-14 ENCOUNTER — Other Ambulatory Visit: Payer: Self-pay

## 2020-11-14 ENCOUNTER — Ambulatory Visit
Admission: RE | Admit: 2020-11-14 | Discharge: 2020-11-14 | Disposition: A | Discharge status: Home / Self Care | Payer: Managed Care, Other (non HMO) | Source: Ambulatory Visit | Attending: Family Medicine | Admitting: Family Medicine

## 2020-11-14 DIAGNOSIS — Z1231 Encounter for screening mammogram for malignant neoplasm of breast: Secondary | ICD-10-CM | POA: Insufficient documentation

## 2020-12-06 ENCOUNTER — Other Ambulatory Visit: Payer: Self-pay

## 2020-12-06 ENCOUNTER — Encounter: Payer: Self-pay | Admitting: Internal Medicine

## 2020-12-06 ENCOUNTER — Other Ambulatory Visit: Payer: Self-pay | Admitting: Internal Medicine

## 2020-12-06 ENCOUNTER — Ambulatory Visit: Payer: Managed Care, Other (non HMO) | Admitting: Internal Medicine

## 2020-12-06 VITALS — BP 128/88 | HR 84 | Ht 68.0 in | Wt 196.0 lb

## 2020-12-06 DIAGNOSIS — Z90710 Acquired absence of both cervix and uterus: Secondary | ICD-10-CM | POA: Insufficient documentation

## 2020-12-06 DIAGNOSIS — Z1211 Encounter for screening for malignant neoplasm of colon: Secondary | ICD-10-CM | POA: Diagnosis not present

## 2020-12-06 DIAGNOSIS — J3089 Other allergic rhinitis: Secondary | ICD-10-CM

## 2020-12-06 DIAGNOSIS — F419 Anxiety disorder, unspecified: Secondary | ICD-10-CM

## 2020-12-06 DIAGNOSIS — M25562 Pain in left knee: Secondary | ICD-10-CM

## 2020-12-06 DIAGNOSIS — M25561 Pain in right knee: Secondary | ICD-10-CM | POA: Diagnosis not present

## 2020-12-06 DIAGNOSIS — K219 Gastro-esophageal reflux disease without esophagitis: Secondary | ICD-10-CM

## 2020-12-06 DIAGNOSIS — F5105 Insomnia due to other mental disorder: Secondary | ICD-10-CM

## 2020-12-06 DIAGNOSIS — G8929 Other chronic pain: Secondary | ICD-10-CM

## 2020-12-06 MED ORDER — SERTRALINE HCL 25 MG PO TABS: 25.0000 mg | ORAL_TABLET | Freq: Every day | ORAL | 2 refills | Status: DC

## 2020-12-06 NOTE — Progress Notes (Signed)
Date:  12/06/2020   Name:  Toni Vang   DOB:  1966/05/01   MRN:  [id number]   Chief Complaint: Establish Care, Anxiety (GAD7- 18), and Knee Pain (Right knee - sharp shooting pain. Was told in past she has arthritis. Wants to know if you have any suggest. Tries heat and creams over the counter. Wants to be checked for RA with labs.)  Anxiety Presents for follow-up (long standing but worse recently with family health issues) visit. Symptoms include excessive worry, insomnia, nervous/anxious behavior and restlessness. Patient reports no chest pain, dizziness or suicidal ideas. Symptoms occur most days. The severity of symptoms is moderate. The quality of sleep is fair. Nighttime awakenings: several.    Knee Pain  There was no injury mechanism (long hx of over use - played sports in HS). The pain is present in the right knee and left knee. The quality of the pain is described as aching. The pain is moderate. The pain has been fluctuating since onset. Associated symptoms include an inability to bear weight. Pertinent negatives include no muscle weakness, numbness or tingling. The symptoms are aggravated by weight bearing. She has tried NSAIDs for the symptoms. The treatment provided moderate relief.  Gastroesophageal Reflux She complains of heartburn and water brash. She reports no abdominal pain, no chest pain, no choking, no coughing or no dysphagia. This is a recurrent problem. The problem occurs frequently. The problem has been unchanged. The heartburn is located in the substernum. The heartburn is of moderate intensity. The heartburn wakes her from sleep. Pertinent negatives include no fatigue. She has tried a PPI and a histamine-2 antagonist for the symptoms. The treatment provided moderate relief.  Insomnia Primary symptoms: sleep disturbance, frequent awakening.  The current episode started more than one month. The problem occurs nightly. The problem is unchanged. The symptoms are  aggravated by anxiety and family issues (hot flashes). Past treatments include medication. The treatment provided moderate relief.    Lab Results  Component Value Date   CREATININE 0.6 09/07/2019   BUN 11 09/07/2019   NA 138 09/07/2019   K 4.2 09/07/2019   CL 103 09/07/2019   CO2 29 (A) 09/07/2019   Lab Results  Component Value Date   CHOL 172 05/08/2018   HDL 55 05/08/2018   LDLCALC 93 05/08/2018   TRIG 119 05/08/2018   CHOLHDL 3.1 05/08/2018   Lab Results  Component Value Date   TSH 0.953 05/08/2018   No results found for: HGBA1C Lab Results  Component Value Date   WBC 8.9 05/08/2018   HGB 12.2 05/08/2018   HCT 35.9 05/08/2018   MCV 85 05/08/2018   PLT 331 05/08/2018   Lab Results  Component Value Date   ALT 13 05/08/2018   AST 13 05/08/2018   ALKPHOS 75 05/08/2018   BILITOT 0.2 05/08/2018     Review of Systems  Constitutional: Negative for chills, fatigue and fever.  HENT: Negative for trouble swallowing.   Eyes: Negative for visual disturbance.  Respiratory: Negative for cough and choking.   Cardiovascular: Negative for chest pain.  Gastrointestinal: Positive for heartburn. Negative for abdominal pain, constipation, diarrhea and dysphagia.  Musculoskeletal: Positive for arthralgias. Negative for gait problem, joint swelling and myalgias.  Neurological: Negative for dizziness, tingling, numbness and headaches.  Psychiatric/Behavioral: Positive for sleep disturbance. Negative for dysphoric mood and suicidal ideas. The patient is nervous/anxious and has insomnia.     Patient Active Problem List   Diagnosis Date Noted  .  S/P TAH (total abdominal hysterectomy) 12/06/2020  . Gastroesophageal reflux disease without esophagitis 08/24/2019  . Anxiety 09/16/2018  . Vitamin D deficiency 08/22/2015    Allergies  Allergen Reactions  . Codeine Itching  . Codeine Sulfate Nausea Only  . Erythromycin Nausea Only  . Latex Rash    Past Surgical History:   Procedure Laterality Date  . ABDOMINAL HYSTERECTOMY  2009  . BLADDER SURGERY  2009  . DILATION AND CURETTAGE OF UTERUS  1989  . LAPAROSCOPY    . NASAL SINUS SURGERY      Social History   Tobacco Use  . Smoking status: Never Smoker  . Smokeless tobacco: Never Used  Vaping Use  . Vaping Use: Never used  Substance Use Topics  . Alcohol use: No  . Drug use: No     Medication list has been reviewed and updated.  No outpatient medications have been marked as taking for the 12/06/20 encounter (Office Visit) with [name], MD.    [facility] 2/9 Scores 12/06/2020  PHQ - 2 Score 0  PHQ- 9 Score 8    GAD 7 : Generalized Anxiety Score 12/06/2020  Nervous, Anxious, on Edge 3  Control/stop worrying 3  Worry too much - different things 3  Trouble relaxing 3  Restless 2  Easily annoyed or irritable 2  Afraid - awful might happen 2  Total GAD 7 Score 18  Anxiety Difficulty Very difficult    BP Readings from Last 3 Encounters:  12/06/20 128/88  07/05/20 (!) 140/80  06/03/20 (!) 135/92    Physical Exam Vitals and nursing note reviewed.  Constitutional:      General: She is not in acute distress.    Appearance: Normal appearance. She is well-developed.  HENT:     Head: Normocephalic and atraumatic.  Neck:     Vascular: No carotid bruit.  Cardiovascular:     Rate and Rhythm: Normal rate and regular rhythm.     Pulses: Normal pulses.     Heart sounds: No murmur heard.   Pulmonary:     Effort: Pulmonary effort is normal. No respiratory distress.     Breath sounds: No wheezing or rhonchi.  Musculoskeletal:     Right wrist: Swelling present. No tenderness. Normal range of motion.     Left wrist: No swelling or tenderness. Normal range of motion.     Cervical back: Normal range of motion.     Right knee: Crepitus present. No effusion or erythema. Decreased range of motion.     Left knee: Crepitus present. No effusion or erythema. Normal range of motion.     Right  lower leg: No edema.     Left lower leg: No edema.  Lymphadenopathy:     Cervical: No cervical adenopathy.  Skin:    General: Skin is warm and dry.     Findings: No rash.  Neurological:     Mental Status: She is alert and oriented to person, place, and time.  Psychiatric:        Attention and Perception: Attention normal.        Mood and Affect: Mood is anxious.        Speech: Speech normal.        Behavior: Behavior normal.        Thought Content: Thought content does not include suicidal ideation. Thought content does not include suicidal plan.     Wt Readings from Last 3 Encounters:  12/06/20 196 lb (88.9 kg)  07/05/20 185  lb (83.9 kg)  06/03/20 185 lb (83.9 kg)    BP 128/88   Pulse 84   Ht 5\' 8"  (1.727 m)   Wt 196 lb (88.9 kg)   SpO2 98%   BMI 29.80 kg/m   Assessment and Plan: 1. Anxiety High level of family stress currently - special needs sister who is now blind/mother with early dementia/husband with severe crohn's diease.  Has taken zoloft in the past so will start with a low dose and monitor - sertraline (ZOLOFT) 25 MG tablet; Take 1 tablet (25 mg total) by mouth daily.  Dispense: 30 tablet; Refill: 2  2. Colon cancer screening overdue - Ambulatory referral to Gastroenterology  3. Chronic pain of both knees Most likely OA  Recommend Aleve or Advil as needed - limited by GERD sx Will rule out inflammatory arthritis - Rheumatoid factor - CBC with Differential/Platelet - Comprehensive metabolic panel - Sedimentation rate  4. Environmental and seasonal allergies Continue zyrtec  5. Gastroesophageal reflux disease without esophagitis Continue omeprazole 40 mg bid and Pepcid PRN Recommend EGD due to severe reflux symptoms - Ambulatory referral to Gastroenterology  6. Insomnia secondary to anxiety Currently taking 2 tylenol PM and 2 melatonin 10 mg to sleep   Partially dictated using . Any errors are unintentional.  Animal nutritionist,  MD [facility] [place] [facility]  12/06/2020

## 2020-12-07 LAB — CBC WITH DIFFERENTIAL/PLATELET
Basophils Absolute: 0.1 10*3/uL (ref 0.0–0.2)
Basos: 1 %
EOS (ABSOLUTE): 0.4 10*3/uL (ref 0.0–0.4)
Eos: 4 %
Hematocrit: 40.6 % (ref 34.0–46.6)
Hemoglobin: 13.8 g/dL (ref 11.1–15.9)
Immature Grans (Abs): 0 10*3/uL (ref 0.0–0.1)
Immature Granulocytes: 0 %
Lymphocytes Absolute: 3.2 10*3/uL — ABNORMAL HIGH (ref 0.7–3.1)
Lymphs: 32 %
MCH: 29.1 pg (ref 26.6–33.0)
MCHC: 34 g/dL (ref 31.5–35.7)
MCV: 86 fL (ref 79–97)
Monocytes Absolute: 0.6 10*3/uL (ref 0.1–0.9)
Monocytes: 6 %
Neutrophils Absolute: 5.7 10*3/uL (ref 1.4–7.0)
Neutrophils: 57 %
Platelets: 348 10*3/uL (ref 150–450)
RBC: 4.75 x10E6/uL (ref 3.77–5.28)
RDW: 12.9 % (ref 11.7–15.4)
WBC: 9.9 10*3/uL (ref 3.4–10.8)

## 2020-12-07 LAB — COMPREHENSIVE METABOLIC PANEL
ALT: 15 IU/L (ref 0–32)
AST: 15 IU/L (ref 0–40)
Albumin/Globulin Ratio: 1.7 (ref 1.2–2.2)
Albumin: 4.6 g/dL (ref 3.8–4.9)
Alkaline Phosphatase: 108 IU/L (ref 44–121)
BUN/Creatinine Ratio: 14 (ref 9–23)
BUN: 9 mg/dL (ref 6–24)
Bilirubin Total: <0.2 mg/dL (ref 0.0–1.2)
CO2: 25 mmol/L (ref 20–29)
Calcium: 10 mg/dL (ref 8.7–10.2)
Chloride: 104 mmol/L (ref 96–106)
Creatinine, Ser: 0.63 mg/dL (ref 0.57–1.00)
GFR calc Af Amer: 118 mL/min/{1.73_m2} (ref >59)
GFR calc non Af Amer: 102 mL/min/{1.73_m2} (ref >59)
Globulin, Total: 2.7 g/dL (ref 1.5–4.5)
Glucose: 92 mg/dL (ref 65–99)
Potassium: 4.9 mmol/L (ref 3.5–5.2)
Sodium: 143 mmol/L (ref 134–144)
Total Protein: 7.3 g/dL (ref 6.0–8.5)

## 2020-12-07 LAB — SEDIMENTATION RATE: Sed Rate: 2 mm/hr (ref 0–40)

## 2020-12-07 LAB — RHEUMATOID FACTOR: Rheumatoid fact SerPl-aCnc: <10 IU/mL (ref <14.0)

## 2020-12-09 ENCOUNTER — Other Ambulatory Visit: Payer: Self-pay | Admitting: Internal Medicine

## 2020-12-09 DIAGNOSIS — Z1211 Encounter for screening for malignant neoplasm of colon: Secondary | ICD-10-CM

## 2021-01-18 ENCOUNTER — Ambulatory Visit: Payer: Managed Care, Other (non HMO) | Admitting: Internal Medicine

## 2021-01-24 ENCOUNTER — Ambulatory Visit: Payer: Managed Care, Other (non HMO) | Admitting: Internal Medicine

## 2021-03-18 ENCOUNTER — Other Ambulatory Visit: Payer: Self-pay

## 2021-03-18 ENCOUNTER — Ambulatory Visit
Admission: RE | Admit: 2021-03-18 | Discharge: 2021-03-18 | Disposition: A | Discharge status: Home / Self Care | Payer: Managed Care, Other (non HMO) | Source: Ambulatory Visit | Attending: Emergency Medicine | Admitting: Emergency Medicine

## 2021-03-18 VITALS — BP 152/98 | HR 116 | Temp 99.3°F | Resp 18 | Ht 68.0 in | Wt 190.0 lb

## 2021-03-18 DIAGNOSIS — J320 Chronic maxillary sinusitis: Secondary | ICD-10-CM

## 2021-03-18 MED ORDER — PREDNISONE 10 MG (21) PO TBPK
ORAL_TABLET | ORAL | 0 refills | Status: DC
Start: 2021-03-18 — End: 2021-04-09

## 2021-03-18 MED ORDER — AMOXICILLIN-POT CLAVULANATE 875-125 MG PO TABS
1.0000 | ORAL_TABLET | Freq: Two times a day (BID) | ORAL | 0 refills | Status: AC
Start: 2021-03-18 — End: 2021-03-28

## 2021-03-18 MED ORDER — PROMETHAZINE-DM 6.25-15 MG/5ML PO SYRP
5.0000 mL | ORAL_SOLUTION | Freq: Four times a day (QID) | ORAL | 0 refills | Status: DC | PRN
Start: 2021-03-18 — End: 2021-04-09

## 2021-03-18 NOTE — ED Provider Notes (Signed)
[facility]    CSN: [id number] Arrival date & time: 03/18/21  0844      History   Chief Complaint Chief Complaint  Patient presents with  . Facial Pain    HPI Toni Vang is a 55 y.o. female.   HPI   55 year old female here for evaluation of sinus symptoms.  Patient reports that she has been experiencing sinus pain and pressure in conjunction with nasal congestion and a runny nose for greater than a week.  She does endorse a cough but states that it is mainly at nighttime when her sinuses drain.  The cough is associated with shortness of breath and wheezing at night as well.  Patient reports that she has had fatigue has felt clammy and sweaty with a mildly elevated temp of 99.  Patient is 99 3 today.  Patient is also complaining of bilateral ear pressure and a productive cough for green sputum.  Patient denies headache, GI complaints, or body aches.  Patient has a longstanding history of sinusitis and is followed by Dr. Harold Barban in [place].  Patient reports that she did have sinus surgery approximately 10 years ago.  Past Medical History:  Diagnosis Date  . Acid reflux   . Arthritis   . 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 Hypertension   . Personal history of chemotherapy     Patient Active Problem List   Diagnosis Date Noted  . S/P TAH (total abdominal hysterectomy) 12/06/2020  . Environmental and seasonal allergies 12/06/2020  . Chronic pain of both knees 12/06/2020  . 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 Gastroesophageal reflux disease without esophagitis 08/24/2019  . Anxiety 09/16/2018  . Vitamin D deficiency 08/22/2015    Past Surgical History:  Procedure Laterality Date  . ABDOMINAL HYSTERECTOMY  2009  . BLADDER SURGERY  2009  . DILATION AND CURETTAGE OF UTERUS  1989  . LAPAROSCOPY    . NASAL SINUS SURGERY      OB History    Gravida  4   Para  3   Term  3   Preterm      AB  1   Living  3     SAB  1   IAB       Ectopic      Multiple      Live Births  3            Home Medications    Prior to Admission medications   Medication Sig Start Date End Date Taking? Authorizing Provider  amoxicillin-clavulanate (AUGMENTIN) 875-125 MG tablet Take 1 tablet by mouth every 12 (twelve) hours for 10 days. 03/18/21 03/28/21 Yes Margarette Canada, NP  cetirizine (ZYRTEC) 10 MG tablet Take 10 mg by mouth daily.   Yes [provider]  famotidine (PEPCID) 10 MG tablet Take by mouth. 09/16/18 03/18/21 Yes [provider]  omeprazole (PRILOSEC) 20 MG capsule Take 40 mg by mouth 2 (two) times daily before a meal.   Yes [provider]  predniSONE (STERAPRED UNI-PAK 21 TAB) 10 MG (21) TBPK tablet Take 6 tablets on day 1, 5 tablets day 2, 4 tablets day 3, 3 tablets day 4, 2 tablets day 5, 1 tablet day 6 03/18/21  Yes Margarette Canada, NP  promethazine-dextromethorphan (PROMETHAZINE-DM) 6.25-15 MG/5ML syrup Take 5 mLs by mouth 4 (four) times daily as needed. 03/18/21  Yes Margarette Canada, NP  escitalopram (LEXAPRO) 10 MG tablet Take 1 tablet (10 mg total) by  mouth daily. 03/28/20 06/03/20  Philip Aspen, CNM  sertraline (ZOLOFT) 25 MG tablet Take 1 tablet (25 mg total) by mouth daily. 12/06/20 03/18/21  Glean Hess, MD    Family History Family History  Problem Relation Age of Onset  . Diabetes Mother   . Hypertension Mother   . Breast cancer Mother 3  . Dementia Mother   . Hypertension Father   . Cancer Maternal Aunt        ovarian  . Diabetes Maternal Grandmother   . Diabetes Maternal Grandfather     Social History Social History   Tobacco Use  . Smoking status: Never Smoker  . Smokeless tobacco: Never Used  Vaping Use  . Vaping Use: Never used  Substance Use Topics  . Alcohol use: No  . Drug use: No     Allergies   Codeine, Codeine sulfate, Erythromycin, and Latex   Review of Systems Review of Systems  Constitutional: Positive for chills and fatigue. Negative for activity  change, appetite change and fever.  HENT: Positive for congestion, ear pain, rhinorrhea, sinus pressure, sinus pain and sore throat.   Respiratory: Positive for cough, shortness of breath and wheezing.   Gastrointestinal: Negative for diarrhea, nausea and vomiting.  Musculoskeletal: Negative for arthralgias and myalgias.  Skin: Negative for rash.  Neurological: Negative for headaches.  Hematological: Negative.   Psychiatric/Behavioral: Negative.      Physical Exam Triage Vital Signs ED Triage Vitals  Enc Vitals Group     BP      Pulse      Resp      Temp      Temp src      SpO2      Weight      Height      Head Circumference      Peak Flow      Pain Score      Pain Loc      Pain Edu?      Excl. in [place]?    No data found.  Updated Vital Signs BP (!) 152/98 (BP Location: Left Arm)   Pulse (!) 116   Temp 99.3 F (37.4 C) (Oral)   Resp 18   Ht 5' 8"  (1.727 m)   Wt 190 lb (86.2 kg)   SpO2 98%   BMI 28.89 kg/m   Visual Acuity Right Eye Distance:   Left Eye Distance:   Bilateral Distance:    Right Eye Near:   Left Eye Near:    Bilateral Near:     Physical Exam Vitals and nursing note reviewed.  Constitutional:      General: She is not in acute distress.    Appearance: Normal appearance. She is not ill-appearing.  HENT:     Head: Normocephalic and atraumatic.     Right Ear: Tympanic membrane, ear canal and external ear normal. There is no impacted cerumen.     Left Ear: Tympanic membrane, ear canal and external ear normal. There is no impacted cerumen.     Nose: Congestion and rhinorrhea present.     Mouth/Throat:     Mouth: Mucous membranes are moist.     Pharynx: Oropharynx is clear. Posterior oropharyngeal erythema present.  Cardiovascular:     Rate and Rhythm: Regular rhythm. Tachycardia present.     Pulses: Normal pulses.     Heart sounds: Normal heart sounds.  Pulmonary:     Effort: Pulmonary effort is normal.     Breath sounds: Normal breath  sounds. No wheezing, rhonchi or rales.  Musculoskeletal:     Cervical back: Normal range of motion and neck supple.  Lymphadenopathy:     Cervical: No cervical adenopathy.  Skin:    General: Skin is warm and dry.     Capillary Refill: Capillary refill takes less than 2 seconds.     Findings: No erythema or rash.  Neurological:     General: No focal deficit present.     Mental Status: She is alert and oriented to person, place, and time.  Psychiatric:        Mood and Affect: Mood normal.        Behavior: Behavior normal.        Thought Content: Thought content normal.        Judgment: Judgment normal.      UC Treatments / Results  Labs (all labs ordered are listed, but only abnormal results are displayed) Labs Reviewed - No data to display  EKG   Radiology No results found.  Procedures Procedures (including critical care time)  Medications Ordered in UC Medications - No data to display  Initial Impression / Assessment and Plan / UC Course  I have reviewed the triage vital signs and the nursing notes.  Pertinent labs & imaging results that were available during my care of the patient were reviewed by me and considered in my medical decision making (see chart for details).   Patient is a very pleasant, nontoxic-appearing 55 year old female here for evaluation of sinus symptoms that have been going on for over a week.  Physical exam reveals pearly gray tympanic membranes bilaterally with a normal light reflex and clear external auditory canals.  Bilateral eustachian tubes are tender with palpation.  Nasal mucosa is erythematous and edematous with thick yellow discharge clearing to the walls of both naris and patient's maxillary sinuses are tender to percussion.  Posterior oropharynx demonstrates erythema and yellow postnasal drip.  No cervical lymphadenopathy appreciated.  Lungs are clear to auscultation all fields.  Given patient's longstanding history of sinusitis and  purulent discharge in both naris I will cover with Augmentin 875 mg twice daily x10 days despite the less than 10-day duration of symptoms.  We will also give patient prednisone Dosepak and Promethazine DM cough syrup to help with congestion and cough at nighttime.  Patient advised that if her symptoms do not improve she is to follow-up with Dr. Harold Barban her ENT in [facility].   Final Clinical Impressions(s) / UC Diagnoses   Final diagnoses:  Chronic maxillary sinusitis     Discharge Instructions     The Augmentin twice daily with food for 10 days for treatment of your sinusitis.  Perform sinus irrigation 2-3 times a day with a [employer]Med sinus rinse kit and distilled water.  Do not use tap water.  Take the prednisone Dosepak go to the package instructions.  Take the promethazine cough syrup at bedtime as needed for cough and congestion.  You can use plain over-the-counter Mucinex every 6 hours to break up the stickiness of the mucus so your body can clear it.  Increase your oral fluid intake to thin out your mucus so that is also able for your body to clear more easily.  Take an over-the-counter probiotic, such as Culturelle-align-activia, 1 hour after each dose of antibiotic to prevent diarrhea.  If you develop any new or worsening symptoms return for reevaluation or see your primary care provider.     ED Prescriptions    Medication  Sig Dispense Auth. Provider   amoxicillin-clavulanate (AUGMENTIN) 875-125 MG tablet Take 1 tablet by mouth every 12 (twelve) hours for 10 days. 20 tablet Margarette Canada, NP   predniSONE (STERAPRED UNI-PAK 21 TAB) 10 MG (21) TBPK tablet Take 6 tablets on day 1, 5 tablets day 2, 4 tablets day 3, 3 tablets day 4, 2 tablets day 5, 1 tablet day 6 21 tablet Margarette Canada, NP   promethazine-dextromethorphan (PROMETHAZINE-DM) 6.25-15 MG/5ML syrup Take 5 mLs by mouth 4 (four) times daily as needed. 118 mL Margarette Canada, NP     PDMP not reviewed this encounter.    Margarette Canada, NP 03/18/21 [phone]

## 2021-03-18 NOTE — ED Triage Notes (Signed)
Patient states that she has been having sinus pain and pressure with runny nose and nasal congestion with cough at night x 1 week. States that she did a home covid test that was negative.

## 2021-03-18 NOTE — Discharge Instructions (Addendum)
The Augmentin twice daily with food for 10 days for treatment of your sinusitis.  Perform sinus irrigation 2-3 times a day with a NeilMed sinus rinse kit and distilled water.  Do not use tap water.  Take the prednisone Dosepak go to the package instructions.  Take the promethazine cough syrup at bedtime as needed for cough and congestion.  You can use plain over-the-counter Mucinex every 6 hours to break up the stickiness of the mucus so your body can clear it.  Increase your oral fluid intake to thin out your mucus so that is also able for your body to clear more easily.  Take an over-the-counter probiotic, such as Culturelle-align-activia, 1 hour after each dose of antibiotic to prevent diarrhea.  If you develop any new or worsening symptoms return for reevaluation or see your primary care provider.

## 2021-04-09 NOTE — Progress Notes (Signed)
Date:  04/10/2021   Name:  Toni Vang   DOB:  [birth year]   MRN:  [id number]   Chief Complaint: Annual Exam (No Breast Exam- see's GYN for this. No pap. ) Covid Positive 4/9.  Seen in [facility]in 03/30/21 with SOB.  Prescribed albuterol and cough syrup.  CXR done but results not visible in Mychart. (verbal report - NACPD)  Toni Vang is a 55 y.o. female who presents today for her Complete Annual Exam. She feels fairly well. She reports exercising - 3 days weekly. She reports she is sleeping poorly. Breast complaints - none.  Sees GYN for pap and pelvic/breast exam.  Mammogram: 11/2020 DEXA: none Pap smear: discontinued Colonoscopy: none - overdue  Immunization History  Administered Date(s) Administered  . PFIZER SARS-COV-2 Pediatric Vaccination 5-63yrs 04/15/2020  . PFIZER(Purple Top)SARS-COV-2 Vaccination 03/25/2020    Gastroesophageal Reflux She complains of heartburn. She reports no abdominal pain, no chest pain, no coughing or no wheezing. This is a recurrent problem. Pertinent negatives include no fatigue. She has tried a PPI for the symptoms. The treatment provided significant relief. EGD and Colonoscopy to be schedueled.  Knee Pain  There was no injury mechanism. The pain is present in the right knee and left knee (right knee is worse). The quality of the pain is described as aching. The pain is mild. The pain has been fluctuating since onset. She has tried acetaminophen for the symptoms. The treatment provided mild relief.    Lab Results  Component Value Date   CREATININE 0.63 12/06/2020   BUN 9 12/06/2020   NA 143 12/06/2020   K 4.9 12/06/2020   CL 104 12/06/2020   CO2 25 12/06/2020   Lab Results  Component Value Date   CHOL 172 05/08/2018   HDL 55 05/08/2018   LDLCALC 93 05/08/2018   TRIG 119 05/08/2018   CHOLHDL 3.1 05/08/2018   Lab Results  Component Value Date   TSH 0.953 05/08/2018   No results found for: HGBA1C Lab Results  Component Value Date    WBC 9.9 12/06/2020   HGB 13.8 12/06/2020   HCT 40.6 12/06/2020   MCV 86 12/06/2020   PLT 348 12/06/2020   Lab Results  Component Value Date   ALT 15 12/06/2020   AST 15 12/06/2020   ALKPHOS 108 12/06/2020   BILITOT <0.2 12/06/2020     Review of Systems  Constitutional: Negative for chills, fatigue and fever.  HENT: Negative for congestion, hearing loss, tinnitus, trouble swallowing and voice change.   Eyes: Negative for visual disturbance.  Respiratory: Negative for cough, chest tightness, shortness of breath and wheezing.   Cardiovascular: Negative for chest pain, palpitations and leg swelling.  Gastrointestinal: Positive for heartburn. Negative for abdominal pain, constipation, diarrhea and vomiting.  Endocrine: Negative for polydipsia and polyuria.  Genitourinary: Negative for dysuria, frequency, genital sores, vaginal bleeding and vaginal discharge.  Musculoskeletal: Positive for arthralgias (knees) and myalgias. Negative for gait problem and joint swelling.  Skin: Negative for color change and rash.  Neurological: Negative for dizziness, tremors, light-headedness and headaches.  Hematological: Negative for adenopathy. Does not bruise/bleed easily.  Psychiatric/Behavioral: Negative for dysphoric mood and sleep disturbance. The patient is not nervous/anxious.     Patient Active Problem List   Diagnosis Date Noted  . S/P TAH (total abdominal hysterectomy) 12/06/2020  . Environmental and seasonal allergies 12/06/2020  . Chronic pain of both knees 12/06/2020  . 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 Gastroesophageal reflux disease  without esophagitis 08/24/2019  . Anxiety 09/16/2018  . Vitamin D deficiency 08/22/2015    Allergies  Allergen Reactions  . Codeine Itching  . Codeine Sulfate Nausea Only  . Erythromycin Nausea Only  . Latex Rash    Past Surgical History:  Procedure Laterality Date  . ABDOMINAL HYSTERECTOMY  2009  . BLADDER SURGERY  2009  .  DILATION AND CURETTAGE OF UTERUS  1989  . LAPAROSCOPY    . NASAL SINUS SURGERY      Social History   Tobacco Use  . Smoking status: Never Smoker  . Smokeless tobacco: Never Used  Vaping Use  . Vaping Use: Never used  Substance Use Topics  . Alcohol use: No  . Drug use: No     Medication list has been reviewed and updated.  Current Meds  Medication Sig  . albuterol (PROVENTIL) (2.5 MG/3ML) 0.083% nebulizer solution Inhale into the lungs.  . budesonide (PULMICORT) 1 MG/2ML nebulizer solution Inhale into the lungs.  . cetirizine (ZYRTEC) 10 MG tablet Take 10 mg by mouth daily.  . Cholecalciferol 125 MCG (5000 UT) TABS Take by mouth.  Marland Kitchen omeprazole (PRILOSEC) 20 MG capsule Take 40 mg by mouth 2 (two) times daily before a meal.    PHQ 2/9 Scores 04/10/2021 12/06/2020  PHQ - 2 Score 0 0  PHQ- 9 Score 0 8    GAD 7 : Generalized Anxiety Score 12/06/2020  Nervous, Anxious, on Edge 3  Control/stop worrying 3  Worry too much - different things 3  Trouble relaxing 3  Restless 2  Easily annoyed or irritable 2  Afraid - awful might happen 2  Total GAD 7 Score 18  Anxiety Difficulty Very difficult    BP Readings from Last 3 Encounters:  04/10/21 124/78  03/18/21 (!) 152/98  12/06/20 128/88    Physical Exam Vitals and nursing note reviewed.  Constitutional:      General: She is not in acute distress.    Appearance: She is well-developed.  HENT:     Head: Normocephalic and atraumatic.     Right Ear: Tympanic membrane and ear canal normal.     Left Ear: Tympanic membrane and ear canal normal.     Nose:     Right Sinus: No maxillary sinus tenderness.     Left Sinus: No maxillary sinus tenderness.  Eyes:     General: No scleral icterus.       Right eye: No discharge.        Left eye: No discharge.     Conjunctiva/sclera: Conjunctivae normal.  Neck:     Thyroid: No thyromegaly.     Vascular: No carotid bruit.  Cardiovascular:     Rate and Rhythm: Normal rate and  regular rhythm.     Pulses: Normal pulses.     Heart sounds: Normal heart sounds.  Pulmonary:     Effort: Pulmonary effort is normal. No respiratory distress.     Breath sounds: No wheezing.  Abdominal:     General: Bowel sounds are normal.     Palpations: Abdomen is soft.     Tenderness: There is no abdominal tenderness.  Musculoskeletal:     Cervical back: Normal range of motion. No erythema.     Right lower leg: No edema.     Left lower leg: No edema.  Lymphadenopathy:     Cervical: No cervical adenopathy.  Skin:    General: Skin is warm and dry.     Findings: No rash.  Neurological:  Mental Status: She is alert and oriented to person, place, and time.     Cranial Nerves: No cranial nerve deficit.     Sensory: No sensory deficit.     Deep Tendon Reflexes: Reflexes are normal and symmetric.  Psychiatric:        Attention and Perception: Attention normal.        Mood and Affect: Mood normal.     Wt Readings from Last 3 Encounters:  04/10/21 200 lb (90.7 kg)  03/18/21 190 lb (86.2 kg)  12/06/20 196 lb (88.9 kg)    BP 124/78   Pulse 86   Temp 98.1 F (36.7 C) (Oral)   Ht 5\' 8"  (1.727 m)   Wt 200 lb (90.7 kg)   SpO2 97%   BMI 30.41 kg/m   Assessment and Plan: 1. Annual physical exam Exam is normal except for weight. Encourage regular exercise and appropriate dietary changes.  Has already lost 10 lb with effort. - CBC with Differential/Platelet - Comprehensive metabolic panel - Lipid panel - TSH  2. Encounter for screening mammogram for breast cancer Done in 11/2020 Breast exam deferred to GYN  3. Colon cancer screening Will schedule colonoscopy with [facility]  4. Need for hepatitis C screening test - Hepatitis C antibody  5. Chronic pain of right knee Will get imaging - may be a candidate for gel or steroid injections - DG Knee Complete 4 Views Right; Future  6. Gastroesophageal reflux disease without esophagitis 12/2020 not well controlled on bid  omeprazole 40 mg plus TUMs EGD planned in the near future   Partially dictated using Genella Rife. Any errors are unintentional.  Animal nutritionist, MD [facility]  04/10/2021

## 2021-04-10 ENCOUNTER — Ambulatory Visit
Admission: RE | Admit: 2021-04-10 | Discharge: 2021-04-10 | Disposition: A | Discharge status: Home / Self Care | Payer: Managed Care, Other (non HMO) | Attending: Internal Medicine | Admitting: Internal Medicine

## 2021-04-10 ENCOUNTER — Other Ambulatory Visit: Payer: Self-pay

## 2021-04-10 ENCOUNTER — Ambulatory Visit (INDEPENDENT_AMBULATORY_CARE_PROVIDER_SITE_OTHER): Payer: Managed Care, Other (non HMO) | Admitting: Internal Medicine

## 2021-04-10 ENCOUNTER — Ambulatory Visit
Admission: RE | Admit: 2021-04-10 | Discharge: 2021-04-10 | Disposition: A | Discharge status: Home / Self Care | Payer: Managed Care, Other (non HMO) | Source: Ambulatory Visit | Attending: Internal Medicine | Admitting: Internal Medicine

## 2021-04-10 ENCOUNTER — Encounter: Payer: Self-pay | Admitting: Internal Medicine

## 2021-04-10 VITALS — BP 124/78 | HR 86 | Temp 98.1°F | Ht 68.0 in | Wt 200.0 lb

## 2021-04-10 DIAGNOSIS — G8929 Other chronic pain: Secondary | ICD-10-CM

## 2021-04-10 DIAGNOSIS — M25561 Pain in right knee: Secondary | ICD-10-CM | POA: Diagnosis present

## 2021-04-10 DIAGNOSIS — Z1211 Encounter for screening for malignant neoplasm of colon: Secondary | ICD-10-CM | POA: Diagnosis not present

## 2021-04-10 DIAGNOSIS — Z0000 Encounter for general adult medical examination without abnormal findings: Secondary | ICD-10-CM | POA: Diagnosis not present

## 2021-04-10 DIAGNOSIS — Z1231 Encounter for screening mammogram for malignant neoplasm of breast: Secondary | ICD-10-CM

## 2021-04-10 DIAGNOSIS — Z1159 Encounter for screening for other viral diseases: Secondary | ICD-10-CM

## 2021-04-10 DIAGNOSIS — K219 Gastro-esophageal reflux disease without esophagitis: Secondary | ICD-10-CM

## 2021-04-11 ENCOUNTER — Encounter: Payer: Self-pay | Admitting: Internal Medicine

## 2021-04-11 DIAGNOSIS — E782 Mixed hyperlipidemia: Secondary | ICD-10-CM | POA: Insufficient documentation

## 2021-04-11 LAB — COMPREHENSIVE METABOLIC PANEL
ALT: 23 IU/L (ref 0–32)
AST: 15 IU/L (ref 0–40)
Albumin/Globulin Ratio: 1.5 (ref 1.2–2.2)
Albumin: 4.3 g/dL (ref 3.8–4.9)
Alkaline Phosphatase: 89 IU/L (ref 44–121)
BUN/Creatinine Ratio: 16 (ref 9–23)
BUN: 10 mg/dL (ref 6–24)
Bilirubin Total: <0.2 mg/dL (ref 0.0–1.2)
CO2: 24 mmol/L (ref 20–29)
Calcium: 9.6 mg/dL (ref 8.7–10.2)
Chloride: 104 mmol/L (ref 96–106)
Creatinine, Ser: 0.64 mg/dL (ref 0.57–1.00)
Globulin, Total: 2.9 g/dL (ref 1.5–4.5)
Glucose: 94 mg/dL (ref 65–99)
Potassium: 4.3 mmol/L (ref 3.5–5.2)
Sodium: 143 mmol/L (ref 134–144)
Total Protein: 7.2 g/dL (ref 6.0–8.5)
eGFR: 105 mL/min/{1.73_m2} (ref >59)

## 2021-04-11 LAB — LIPID PANEL
Chol/HDL Ratio: 5.3 ratio — ABNORMAL HIGH (ref 0.0–4.4)
Cholesterol, Total: 278 mg/dL — ABNORMAL HIGH (ref 100–199)
HDL: 52 mg/dL (ref >39)
LDL Chol Calc (NIH): 171 mg/dL — ABNORMAL HIGH (ref 0–99)
Triglycerides: 292 mg/dL — ABNORMAL HIGH (ref 0–149)
VLDL Cholesterol Cal: 55 mg/dL — ABNORMAL HIGH (ref 5–40)

## 2021-04-11 LAB — CBC WITH DIFFERENTIAL/PLATELET
Basophils Absolute: 0 10*3/uL (ref 0.0–0.2)
Basos: 1 %
EOS (ABSOLUTE): 0.4 10*3/uL (ref 0.0–0.4)
Eos: 5 %
Hematocrit: 38 % (ref 34.0–46.6)
Hemoglobin: 12.5 g/dL (ref 11.1–15.9)
Immature Grans (Abs): 0 10*3/uL (ref 0.0–0.1)
Immature Granulocytes: 0 %
Lymphocytes Absolute: 2.6 10*3/uL (ref 0.7–3.1)
Lymphs: 35 %
MCH: 28.3 pg (ref 26.6–33.0)
MCHC: 32.9 g/dL (ref 31.5–35.7)
MCV: 86 fL (ref 79–97)
Monocytes Absolute: 0.6 10*3/uL (ref 0.1–0.9)
Monocytes: 8 %
Neutrophils Absolute: 3.8 10*3/uL (ref 1.4–7.0)
Neutrophils: 51 %
Platelets: 325 10*3/uL (ref 150–450)
RBC: 4.42 x10E6/uL (ref 3.77–5.28)
RDW: 13.5 % (ref 11.7–15.4)
WBC: 7.4 10*3/uL (ref 3.4–10.8)

## 2021-04-11 LAB — HEPATITIS C ANTIBODY: Hep C Virus Ab: <0.1 s/co ratio (ref 0.0–0.9)

## 2021-04-11 LAB — TSH: TSH: 0.925 u[IU]/mL (ref 0.450–4.500)

## 2021-04-12 NOTE — Telephone Encounter (Signed)
Please call pt and schedule a appt with Dr. Ashley Royalty for knee pain.  KP

## 2021-04-13 ENCOUNTER — Telehealth: Payer: Self-pay

## 2021-04-13 ENCOUNTER — Ambulatory Visit: Payer: Managed Care, Other (non HMO) | Admitting: Family Medicine

## 2021-04-13 ENCOUNTER — Encounter: Payer: Self-pay | Admitting: Family Medicine

## 2021-04-13 ENCOUNTER — Other Ambulatory Visit: Payer: Self-pay

## 2021-04-13 VITALS — BP 138/80 | HR 86 | Temp 97.9°F | Ht 68.0 in | Wt 198.0 lb

## 2021-04-13 DIAGNOSIS — M1711 Unilateral primary osteoarthritis, right knee: Secondary | ICD-10-CM | POA: Insufficient documentation

## 2021-04-13 MED ORDER — TRIAMCINOLONE ACETONIDE 40 MG/ML IJ SUSP
60.0000 mg | Freq: Once | INTRAMUSCULAR | Status: AC
  Administered 2021-04-13: 60 mg via INTRA_ARTICULAR

## 2021-04-13 NOTE — Assessment & Plan Note (Signed)
Patient with longstanding right knee pain in setting of mild osteoarthritis with maximal focality to the patellofemoral and medial tibiofemoral articulations.  She has noted swelling, significant stiffness with motion after period of immobility, near buckling episodes roughly once per month.  She denies any trauma but maintains a high level of activity house-related tasks/gardening.  On exam her right knee has no effusion, there is mild tenderness to the medial patellar facet, maximal at the medial joint line, secondarily so at lateral joint line, range of motion is 0--120 degrees with stiffness towards the end of maximal flexion, provocative testing otherwise benign and no crepitus noted on exam.  Given the timeline of symptoms, treatments to date, she was amenable to proceeding with right knee intra-articular corticosteroid injection.  Postinjection care was reviewed, home-based rehab to begin after 1-2 weeks pending symptoms.  Plan for follow-up in 6 weeks for reassessment.  She may benefit from viscosupplementation in the future if indicated.

## 2021-04-13 NOTE — Patient Instructions (Signed)
You have just been given a cortisone injection to reduce pain and inflammation. After the injection you may notice immediate relief of pain as a result of the Lidocaine. It is important to rest the area of the injection for 24 to 48 hours after the injection. There is a possibility of some temporary increased discomfort and swelling for up to 72 hours until the cortisone begins to work. If you do have pain, simply rest the joint and use ice. If you can tolerate over the counter medications, you can try Tylenol, Aleve, or Advil for added relief per package instructions.   - As above, relative rest x2 days and gradually return to normal activity - After 1-2 weeks, continued symptoms, start and gradually advance with the home exercises provided using knee symptoms as a guide - Return for follow-up in 6 weeks, contact us for any questions between now and then

## 2021-04-13 NOTE — Progress Notes (Signed)
Primary Care / Sports Medicine Office Visit  Patient Information:  Patient ID: STEPAHNIE Vang, female DOB: [birth year] Age: 55 y.o. MRN: [id number]   Toni Vang is a pleasant 55 y.o. female presenting with the following:  Chief Complaint  Patient presents with  . New Patient (Initial Visit)  . Knee Pain    Right; started x1 year ago; no known injury; stiffness, swelling, and soreness associated; pain worse at night; 4/10 pain    Review of Systems pertinent details above   Patient Active Problem List   Diagnosis Date Noted  . Primary osteoarthritis of right knee 04/13/2021  . Hyperlipidemia, mixed 04/11/2021  . S/P TAH (total abdominal hysterectomy) 12/06/2020  . Environmental and seasonal allergies 12/06/2020  . Chronic pain of both knees 12/06/2020  . 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 Gastroesophageal reflux disease without esophagitis 08/24/2019  . Anxiety 09/16/2018  . Vitamin D deficiency 08/22/2015   Past Medical History:  Diagnosis Date  . Acid reflux   . Arthritis   . 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 Hypertension   . Personal history of chemotherapy    Outpatient Medications Prior to Visit  Medication Sig Dispense Refill  . albuterol (PROVENTIL) (2.5 MG/3ML) 0.083% nebulizer solution Take 2.5 mg by nebulization every 6 (six) hours as needed.    . budesonide (PULMICORT) 1 MG/2ML nebulizer solution Take 1 mg by nebulization daily as needed.    . cetirizine (ZYRTEC) 10 MG tablet Take 10 mg by mouth daily.    . Cholecalciferol 125 MCG (5000 UT) TABS Take by mouth.    Marland Kitchen omeprazole (PRILOSEC) 20 MG capsule Take 40 mg by mouth 2 (two) times daily before a meal.     No facility-administered medications prior to visit.    Past Surgical History:  Procedure Laterality Date  . ABDOMINAL HYSTERECTOMY  2009  . BLADDER SURGERY  2009  . DILATION AND CURETTAGE OF UTERUS  1989  . LAPAROSCOPY    . NASAL SINUS SURGERY     Social History    Socioeconomic History  . Marital status: Married    Spouse name: Not on file  . Number of children: 3  . Years of education: Not on file  . Highest education level: Not on file  Occupational History  . Not on file  Tobacco Use  . Smoking status: Never Smoker  . Smokeless tobacco: Never Used  Vaping Use  . Vaping Use: Never used  Substance and Sexual Activity  . Alcohol use: Not Currently  . Drug use: No  . Sexual activity: Yes    Birth control/protection: Surgical  Other Topics Concern  . Not on file  Social History Narrative  . Not on file   Social Determinants of Health   Financial Resource Strain: Not on file  Food Insecurity: Not on file  Transportation Needs: Not on file  Physical Activity: Not on file  Stress: Not on file  Social Connections: Not on file  Intimate Partner Violence: Not on file   Family History  Problem Relation Age of Onset  . Diabetes Mother   . Hypertension Mother   . Breast cancer Mother 70  . Dementia Mother   . Hypertension Father   . Cancer Maternal Aunt        ovarian  . Diabetes Maternal Grandmother   . Diabetes Maternal Grandfather    Allergies  Allergen Reactions  . Codeine Itching  . Codeine Sulfate Nausea Only  .  Erythromycin Nausea Only  . Latex Rash    Vitals:   04/13/21 1408  BP: 138/80  Pulse: 86  Temp: 97.9 F (36.6 C)  SpO2: 99%   Vitals:   04/13/21 1408  Weight: 198 lb (89.8 kg)  Height: 5\' 8"  (1.727 m)   Body mass index is 30.11 kg/m.  DG Knee Complete 4 Views Right  Result Date: 04/11/2021 CLINICAL DATA:  Chronic right knee pain. Anterior pain below the patella. EXAM: RIGHT KNEE - COMPLETE 4+ VIEW COMPARISON:  None. FINDINGS: Normal alignment. Joint spaces are preserved. Patella normally situated in the trochlear groove. Mild spurring of the tibial spines, patellofemoral and medial tibiofemoral compartment. Trace joint effusion. No fracture or focal bone lesion. There is mild smooth periosteal  thickening of the proximal fibula. No patellar quadriceps tendon enthesophytes. Unremarkable soft tissues. IMPRESSION: 1. Mild osteoarthritis of the right knee. Trace joint effusion. 2. Mild smooth periosteal thickening of the proximal fibula may be sequela of remote prior injury or seen in the setting of chronic venous stasis. Electronically Signed   By: 06/11/2021 M.D.   On: 04/11/2021 18:59     Independent interpretation of notes and tests performed by another provider:   Independent interpretation of right knee x-ray complete dated 04/10/2021 reveals mild medial tibiofemoral joint space narrowing, sclerosis at the same site, subtle osteophyte formation at the superior and inferior patella, marginal subchondral cystic medial tibial plateau, patella without tilt, no acute osseous process identified   Procedures performed:   Procedure:  Injection of right knee joint under ultrasound guidance. Samsung HS60 device utilized with permanent recording / reporting. Consent obtained and verified. Noted no overlying erythema, induration, or other signs of local infection. Skin prepped in a sterile fashion. Ethyl chloride spray for topical analgesia.  Completed without difficulty and tolerated well. Medication: triamcinolone acetonide 40 mg/mL suspension for injection 1.5 mL total Advised to contact for fevers/chills, erythema, induration, drainage, or persistent bleeding.   Pertinent History, Exam, Impression, and Recommendations:   Primary osteoarthritis of right knee Patient with longstanding right knee pain in setting of mild osteoarthritis with maximal focality to the patellofemoral and medial tibiofemoral articulations.  She has noted swelling, significant stiffness with motion after period of immobility, near buckling episodes roughly once per month.  She denies any trauma but maintains a high level of activity house-related tasks/gardening.  On exam her right knee has no effusion, there  is mild tenderness to the medial patellar facet, maximal at the medial joint line, secondarily so at lateral joint line, range of motion is 0--120 degrees with stiffness towards the end of maximal flexion, provocative testing otherwise benign and no crepitus noted on exam.  Given the timeline of symptoms, treatments to date, she was amenable to proceeding with right knee intra-articular corticosteroid injection.  Postinjection care was reviewed, home-based rehab to begin after 1-2 weeks pending symptoms.  Plan for follow-up in 6 weeks for reassessment.  She may benefit from viscosupplementation in the future if indicated.    Orders & Medications Meds ordered this encounter  Medications  . triamcinolone acetonide (KENALOG-40) injection 60 mg   No orders of the defined types were placed in this encounter.    Return in about 6 weeks (around 05/25/2021) for right knee follow-up.     05/27/2021, MD   Primary Care Sports Medicine [facility]

## 2021-04-13 NOTE — Telephone Encounter (Signed)
-----   Message from [name], MD sent at 04/12/2021  4:27 PM EDT ----- Her 10 yr risk of CAD is low at 2.6%.  However, if she wants to take medication, I can prescribe one and have her follow up in 4 months.  Otherwise, she should follow a low fat diet.

## 2021-05-10 ENCOUNTER — Encounter: Payer: Self-pay | Admitting: Internal Medicine

## 2021-05-11 ENCOUNTER — Ambulatory Visit: Payer: Self-pay | Admitting: Internal Medicine

## 2021-06-02 ENCOUNTER — Ambulatory Visit: Payer: Managed Care, Other (non HMO) | Admitting: Family Medicine

## 2021-06-30 ENCOUNTER — Other Ambulatory Visit: Payer: Self-pay

## 2021-06-30 ENCOUNTER — Ambulatory Visit: Payer: Managed Care, Other (non HMO) | Admitting: Family Medicine

## 2021-06-30 ENCOUNTER — Encounter: Payer: Self-pay | Admitting: Family Medicine

## 2021-06-30 VITALS — BP 112/70 | HR 70 | Temp 98.2°F | Ht 68.0 in | Wt 195.0 lb

## 2021-06-30 DIAGNOSIS — M1711 Unilateral primary osteoarthritis, right knee: Secondary | ICD-10-CM | POA: Diagnosis not present

## 2021-06-30 MED ORDER — IBUPROFEN-FAMOTIDINE 800-26.6 MG PO TABS: 1.0000 | ORAL_TABLET | Freq: Three times a day (TID) | ORAL | 3 refills | Status: DC

## 2021-06-30 NOTE — Progress Notes (Signed)
Primary Care / Sports Medicine Office Visit  Patient Information:  Patient ID: Toni Vang, female DOB: [birth year] Age: 55 y.o. MRN: [id number]   Toni Vang is a pleasant 55 y.o. female presenting with the following:  Chief Complaint  Patient presents with   Primary osteoarthritis of right knee    X-Ray 04/10/21; cortisone injection 04/13/21 with relief; home exercises helpful, but limited ROM per patient; stiffness and pain associated when more active; 4/10 pain    Review of Systems pertinent details above   Patient Active Problem List   Diagnosis Date Noted   Primary osteoarthritis of right knee 04/13/2021   Hyperlipidemia, mixed 04/11/2021   S/P TAH (total abdominal hysterectomy) 12/06/2020   Environmental and seasonal allergies 12/06/2020   Chronic pain of both knees 12/06/2020   Insomnia secondary to anxiety 12/06/2020   Gastroesophageal reflux disease without esophagitis 08/24/2019   Anxiety 09/16/2018   Vitamin D deficiency 08/22/2015   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   cetirizine (ZYRTEC) 10 MG tablet Take 10 mg by mouth daily.   Cholecalciferol 125 MCG (5000 UT) TABS Take by mouth.   Ibuprofen-Famotidine 800-26.6 MG TABS Take 1 tablet by mouth 3 (three) times daily.   omeprazole (PRILOSEC) 20 MG capsule Take 40 mg by mouth 2 (two) times daily before a meal.   [DISCONTINUED] naproxen sodium (ALEVE) 220 MG tablet Take 220 mg by mouth at bedtime.   [DISCONTINUED] albuterol (PROVENTIL) (2.5 MG/3ML) 0.083% nebulizer solution Take 2.5 mg by nebulization every 6 (six) hours as needed.   [DISCONTINUED] budesonide (PULMICORT) 1 MG/2ML nebulizer solution Take 1 mg by nebulization daily  as needed.   [DISCONTINUED] escitalopram (LEXAPRO) 10 MG tablet Take 1 tablet (10 mg total) by mouth daily.   [DISCONTINUED] sertraline (ZOLOFT) 25 MG tablet Take 1 tablet (25 mg total) by mouth daily.   No facility-administered encounter medications on file as of 06/30/2021.   Past Surgical History:  Procedure Laterality Date   ABDOMINAL HYSTERECTOMY  2009   BLADDER SURGERY  2009   DILATION AND CURETTAGE OF UTERUS  1989   LAPAROSCOPY     NASAL SINUS SURGERY      Vitals:   06/30/21 1137  BP: 112/70  Pulse: 70  Temp: 98.2 F (36.8 C)  SpO2: 98%   Vitals:   06/30/21 1137  Weight: 195 lb (88.5 kg)  Height: 5\' 8"  (1.727 m)   Body mass index is 29.65 kg/m.  No results found.   Independent interpretation of notes and tests performed by another provider:   None  Procedures performed:   None  Pertinent History, Exam, Impression, and Recommendations:   Primary osteoarthritis of right knee Patient with known underlying right knee osteoarthritis, received cortisone injection 04/13/2021 with positive response and symptom control until the past few weeks.  Symptoms are described as "stiffness", noted primarily with deep knee bending, sitting to standing, does have some mild diffuse pain.  She is dosing acetaminophen on a as needed basis for this with mild response.  Her examination today shows no effusion, range of motion 0-1 25, contralateral 0-1 30, right knee limited by stiffness/pain, symmetric tenderness  along the medial and lateral joint lines, patellar grind negative, no ligamentous laxity, McMurray's negative.  Given her overall clinical course, findings today, I have advised her various next steps.  She is amenable to samples of topical Pennsaid, special pharmacy Rx [facility] sent, formal physical therapy referral provided, and she will return for follow-up in 2 weeks.  Ultimately, she would benefit from viscosupplementation as an adjunct to the aforementioned treatments, we  will reach out for authorization once appropriate.   Orders & Medications Meds ordered this encounter  Medications   Ibuprofen-Famotidine 800-26.6 MG TABS    Sig: Take 1 tablet by mouth 3 (three) times daily.    Dispense:  90 tablet    Refill:  3    Use PME (Prescriptions Made Easy) Discount Program.   Orders Placed This Encounter  Procedures   Ambulatory referral to Physical Therapy     Return in about 2 weeks (around 07/14/2021).     Jerrol Banana, MD   Primary Care Sports Medicine [facility]

## 2021-06-30 NOTE — Patient Instructions (Signed)
-   Trial topical Pennsaid twice daily scheduled - Start physical therapy and home exercises - Return in 2 weeks - Contact for questions  [facility] Physical Therapy:  [place]:  [phone]

## 2021-06-30 NOTE — Assessment & Plan Note (Signed)
Patient with known underlying right knee osteoarthritis, received cortisone injection 04/13/2021 with positive response and symptom control until the past few weeks.  Symptoms are described as "stiffness", noted primarily with deep knee bending, sitting to standing, does have some mild diffuse pain.  She is dosing acetaminophen on a as needed basis for this with mild response.  Her examination today shows no effusion, range of motion 0-1 25, contralateral 0-1 30, right knee limited by stiffness/pain, symmetric tenderness along the medial and lateral joint lines, patellar grind negative, no ligamentous laxity, McMurray's negative.  Given her overall clinical course, findings today, I have advised her various next steps.  She is amenable to samples of topical Pennsaid, special pharmacy Rx [facility] sent, formal physical therapy referral provided, and she will return for follow-up in 2 weeks.  Ultimately, she would benefit from viscosupplementation as an adjunct to the aforementioned treatments, we will reach out for authorization once appropriate.

## 2021-07-12 ENCOUNTER — Ambulatory Visit: Payer: Managed Care, Other (non HMO) | Attending: Family Medicine | Admitting: Physical Therapy

## 2021-07-12 ENCOUNTER — Other Ambulatory Visit: Payer: Self-pay

## 2021-07-12 ENCOUNTER — Encounter: Payer: Self-pay | Admitting: Physical Therapy

## 2021-07-12 DIAGNOSIS — M6281 Muscle weakness (generalized): Secondary | ICD-10-CM | POA: Diagnosis present

## 2021-07-12 DIAGNOSIS — M25561 Pain in right knee: Secondary | ICD-10-CM | POA: Diagnosis present

## 2021-07-12 DIAGNOSIS — G8929 Other chronic pain: Secondary | ICD-10-CM | POA: Diagnosis present

## 2021-07-12 NOTE — Therapy (Signed)
[facility] [address]. [place], [place], [address] Phone: [phone]   Fax:  [phone]  Physical Therapy Treatment  Patient Details  Name: Toni Vang MRN: [id number] Date of Birth: 22-Dec-1965 Referring Provider (PT): Joseph Berkshire   Encounter Date: 07/12/2021   PT End of Session - 07/12/21 1440     Visit Number 1    Number of Visits 8    Date for PT Re-Evaluation 08/09/21    PT Start Time 1420    PT Stop Time 1500    PT Time Calculation (min) 40 min    Activity Tolerance Patient tolerated treatment well    Behavior During Therapy [facility] for tasks assessed/performed             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   Subjective Assessment - 07/12/21 1427     Subjective Patient notes that she has longstanding R knee pain. She is eager to lose weight with an upcomingfamily wedding. She notes that she has been very busy with family obligations which are also causing some considerable stress. She is unable to exercise 2/2 to pain limiting. Patient does have access to a pool, but with other commitments she is unable to use her pool. Patient does have recent buckling of R knee with static standing. Patient is also having R hamstring pain. Worst pain 8-9/10 with transfers prolonged sitting. Patient can only climb 10 steps because of pain.    Pertinent History Symptoms are described as "stiffness", noted primarily with deep knee bending, sitting to standing, does have some mild diffuse pain.    Limitations Standing;Walking;Sitting;House hold activities    How long can  you sit comfortably? 30 min    How long can you stand comfortably? 60 min    How long can you walk comfortably? 60 min    Patient Stated Goals "I would liek to sit down and not get back up and be in pain. I'd like a little bit more movement."    Currently in Pain? Yes    Pain Score 5     Pain Location Knee    Pain Orientation Right    Pain Descriptors / Indicators Other (Comment)   stiffness   Pain Type Chronic pain    Pain Radiating Towards posterior R thigh    Pain Onset More than a month ago    Pain Frequency Constant    Aggravating Factors  sitting    Pain Relieving Factors cortisone injection;                [facility] PT Assessment - 07/12/21 0001       Assessment   Medical Diagnosis OA of R knee    Referring Provider (PT) Joseph Berkshire    Hand Dominance Right    Next MD Visit 07/14/2021    Prior Therapy None for this dx      Balance Screen   Has the patient fallen in the past 6 months No  SCREENING Red Flags: none Have you had any night sweats? Unexplained weight loss? Saddle anesthesia? Unexplained changes in bowel or bladder habits?  Mental Status Patient is oriented to person, place and time.  Recent memory is intact.  Remote memory is intact.  Attention span and concentration are intact.  Expressive speech is intact.  Patient's fund of knowledge is within normal limits for educational level.  POSTURE/OBSERVATIONS:  Lumbar lordosis: increased Thoracic kyphosis: WNL Iliac crest height: equal bilaterally Pelvic obliquity: R ASIS anterior to L  GAIT: Excess pelvic rotation as well as hip drop during gait. Mild toe-in and supination during gait; further assessment needed. Trendelenburg R: Positive L: Positive  RANGE OF MOTION:    LEFT RIGHT  Lumbar forward flexion (65):  45 degrees    Lumbar extension (30): WNL    Lumbar lateral flexion (25):  WNL WNL  Thoracic and Lumbar rotation (30):    WNL WNL  Hip Flexion (125):   [facility] WFL  Hip  IR (45):  [facility] WFL  Hip ER (45):  [facility] WFL  Hip Abduction (40):  [facility] WFL  Hip extension (15):  [facility] WFL  Knee flexion (135):  [facility] WFL  Knee extension (0):  WFL WFL   SENSATION: Grossly intact to light touch bilateral LEs as determined by testing dermatomes L2-S2 Proprioception and hot/cold testing deferred on this date  STRENGTH: MMT   RLE LLE  Hip Flexion 4 (pain on ITB) 4  Hip Extension 5 5  Hip Abduction (seated) 5 5  Hip Adduction (seated) 5 5  Hip Abduction 3+ 4  Hip Adduction 5 5  Hip ER  4 4  Hip IR  4 4  Knee Extension 5 5  Knee Flexion 5 5  Dorsiflexion  5 5  Plantarflexion (seated) 5 5   MUSCLE LENGTH: IT band length Claiborne Rigg): limited on R  PASSIVE ACCESSORY MOTION: Tibiofemoral Distraction: relieving Tibiofemoral Posterior: aggravating Tibiofemoral Anterior: relieving  PALPATION: Anterior aspect: no TTP noted Medial aspect: no TTP noted Posterior aspect: no TTP noted Lateral aspect: no TTP noted  SPECIAL TESTS:  Meniscus Tests Thessaly Test: Negative  FUNCTIONAL TESTS:  Sit to stand: WNL Lunge:  deferred 2/2 to time constraints Step Down: deferred 2/2 to time constraints    PT Long Term Goals - 07/13/21 1600       PT LONG TERM GOAL #1   Title Patient will be independent with HEP in order to improve pain-controlled participation at home and in the community.    Baseline IE: emailed AM/sitting warm-up ([id number]); hip strengthening [facility])    Time 6    Period Weeks    Status New    Target Date 08/23/21      PT LONG TERM GOAL #2   Title Patient will demonstrate improved function as evidenced by a score of 68 on FOTO measure for full participation in activities at home and in the community.    Baseline IE: 53    Time 6    Period Weeks    Status New    Target Date 08/23/21      PT LONG TERM GOAL #3   Title Patient will report "a little bit of difficulty" to "no difficulty" with both light and heavy household activities for improved strength  and function at home and with family caregiving duties.    Baseline IE: "quite a bit of difficulty"    Time 6    Period Weeks    Status New    Target Date  08/23/21      PT LONG TERM GOAL #4   Title Patient will demonstrate 5/5 global R hip strength (MMT) for improved postural control and decreased load on R knee in order to participate more fully at home and in the community.    Baseline IE: 4/5 (hip abduction 3+/5)    Time 6    Period Weeks    Status New    Target Date 08/23/21      PT LONG TERM GOAL #5   Title Patient will be able to sit for > 30 minutes and transfer to standing with pain and stiffness < 3/10 (NPRS) utilizing appropriate joint mobility strategies to demonstrate independence with self-management of primary complaint.    Baseline IE: unable    Time 6    Period Weeks    Status New    Target Date 08/23/21               ASSESSMENT Patient is a 55 year old presenting to clinic with chief complaints of persistent R knee pain and stiffness. Upon examination, patient demonstrates deficits in R hip strength, posture, pain, and gait as evidenced by 4/5 MMT of hip rotators, 3+/5 R hip abduction, 4/5 R hip flexion with ITB pain, increased lumbar lordosis, R anterior pelvic obliquity, increased supination and toe-in foot posture during gait, B Trendlenburg during gait, and 8-9/10 pain with weight bearing after prolonged sitting. Patient's responses on FOTO outcome measures (53) indicate significant functional limitations/disability/distress. Patient's progress may be limited due to family caregiver role, decreased support, and financial limitations; however, patient's tolerance and low pain irritability on evaluation are advantageous. Patient will benefit from continued skilled therapeutic intervention to address deficits in R hip strength, posture, pain, and gait in order to increase function and improve overall QOL.  EDUCATION Patient educated on prognosis, POC, and provided  with HEP including: AM/sitting warm-up ([id number]) and hip strengthening [facility]). Patient articulated understanding and returned demonstration. Patient will benefit from further education in order to maximize compliance and understanding for long-term therapeutic gains.      Plan - 07/13/21 1013     Clinical Impression Statement Patient is a 55 year old presenting to clinic with chief complaints of persistent R knee pain and stiffness. Upon examination, patient demonstrates deficits in R hip strength, posture, pain, and gait as evidenced by 4/5 MMT of hip rotators, 3+/5 R hip abduction, 4/5 R hip flexion with ITB pain, increased lumbar lordosis, R anterior pelvic obliquity, increased supination and toe-in foot posture during gait, B Trendlenburg during gait, and 8-9/10 pain with weight bearing after prolonged sitting. Patient's responses on FOTO outcome measures (53) indicate significant functional limitations/disability/distress. Patient's progress may be limited due to family caregiver role, decreased support, and financial limitations; however, patient's tolerance and low pain irritability on evaluation are advantageous. Patient will benefit from continued skilled therapeutic intervention to address deficits in R hip strength, posture, pain, and gait in order to increase function and improve overall QOL.    Personal Factors and Comorbidities Behavior Pattern;Comorbidity 3+;Finances;Time since onset of injury/illness/exacerbation;Past/Current Experience    Comorbidities hx of TAH, insomnia, anxiety, hyperlipidemia, HTN    Examination-Activity Limitations Sit;Squat;Stairs;Transfers    Examination-Participation Restrictions Cleaning;Occupation;Driving;Shop;Laundry;Community Activity    Stability/Clinical Decision Making Evolving/Moderate complexity    Clinical Decision Making Moderate    Rehab Potential Fair    PT Frequency 2x / week    PT Duration 6 weeks    PT Treatment/Interventions ADLs/Self  Care Home Management;Cryotherapy;Electrical Stimulation;Moist Heat;Patient/family education;Manual techniques;Joint  Manipulations;Therapeutic exercise;Neuromuscular re-education;Taping    PT Next Visit Plan gait analysis unshod, hip strengthening, postural strengthening    PT Home Exercise Plan AM/sitting warm-up (HGL6ZJML); hip strengthening ([id number])    Consulted and Agree with Plan of Care Patient                 Patient will benefit from skilled therapeutic intervention in order to improve the following deficits and impairments:  Abnormal gait, Decreased range of motion, Pain, Improper body mechanics, Decreased balance, Decreased strength, Decreased activity tolerance, Difficulty walking  Visit Diagnosis: Chronic pain of right knee  Muscle weakness (generalized)     Problem List Patient Active Problem List   Diagnosis Date Noted   Primary osteoarthritis of right knee 04/13/2021   Hyperlipidemia, mixed 04/11/2021   S/P TAH (total abdominal hysterectomy) 12/06/2020   Environmental and seasonal allergies 12/06/2020   Chronic pain of both knees 12/06/2020   Insomnia secondary to anxiety 12/06/2020   Gastroesophageal reflux disease without esophagitis 08/24/2019   Anxiety 09/16/2018   Vitamin D deficiency 08/22/2015    Sheria LangKatlin Daysha Ashmore PT, DPT [phone]#[id number]  07/12/2021, 2:45 PM  [facility] [address] [place], [place], [address] Phone: [phone]   Fax:  [phone]  Name: Toni Vang MRN: [id number] Date of Birth: August 07, 1966

## 2021-07-14 ENCOUNTER — Ambulatory Visit: Payer: Managed Care, Other (non HMO) | Admitting: Family Medicine

## 2021-07-20 ENCOUNTER — Other Ambulatory Visit: Payer: Self-pay

## 2021-07-20 ENCOUNTER — Encounter: Payer: Self-pay | Admitting: Family Medicine

## 2021-07-20 ENCOUNTER — Ambulatory Visit: Payer: Managed Care, Other (non HMO) | Admitting: Family Medicine

## 2021-07-20 VITALS — BP 118/84 | HR 70 | Temp 98.0°F | Ht 68.0 in | Wt 198.0 lb

## 2021-07-20 DIAGNOSIS — M7631 Iliotibial band syndrome, right leg: Secondary | ICD-10-CM

## 2021-07-20 DIAGNOSIS — M1711 Unilateral primary osteoarthritis, right knee: Secondary | ICD-10-CM

## 2021-07-20 MED ORDER — TRIAMCINOLONE ACETONIDE 40 MG/ML IJ SUSP
40.0000 mg | Freq: Once | INTRAMUSCULAR | Status: AC
  Administered 2021-07-20: 40 mg

## 2021-07-20 MED ORDER — DICLOFENAC SODIUM 2 % EX SOLN: 2.0000 | Freq: Two times a day (BID) | CUTANEOUS | 1 refills | Status: DC

## 2021-07-20 NOTE — Progress Notes (Signed)
Primary Care / Sports Medicine Office Visit  Patient Information:  Patient ID: Toni Vang, female DOB: 18-Jul-1966 Age: 55 y.o. MRN: [id number]   Toni Vang is a pleasant 55 y.o. female presenting with the following:  Chief Complaint  Patient presents with   Primary osteoarthritis of right knee    Last cortisone injection 04/13/21 helped up until a week ago per patient; more severe at night (10/10 pain); following with PT; taking Tylenol and ibuprofen with some relief; Duexis denied by insurance; 2/10 pain in office    Review of Systems pertinent details above   Patient Active Problem List   Diagnosis Date Noted   It band syndrome, right 07/20/2021   Primary osteoarthritis of right knee 04/13/2021   Hyperlipidemia, mixed 04/11/2021   S/P TAH (total abdominal hysterectomy) 12/06/2020   Environmental and seasonal allergies 12/06/2020   Chronic pain of both knees 12/06/2020   Insomnia secondary to anxiety 12/06/2020   Gastroesophageal reflux disease without esophagitis 08/24/2019   Anxiety 09/16/2018   Vitamin D deficiency 08/22/2015   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   cetirizine (ZYRTEC) 10 MG tablet Take 10 mg by mouth daily.   Cholecalciferol 125 MCG (5000 UT) TABS Take by mouth.   Diclofenac Sodium 2 % SOLN Place 2 sprays onto the skin 2 (two) times daily.   omeprazole (PRILOSEC) 20 MG capsule Take 40 mg by mouth 2 (two) times daily before a meal.   Ibuprofen-Famotidine 800-26.6 MG TABS Take 1 tablet by mouth 3 (three) times daily. (Patient not taking: Reported on 07/20/2021)   [DISCONTINUED] escitalopram (LEXAPRO) 10 MG tablet Take 1 tablet (10 mg total) by mouth daily.    [DISCONTINUED] sertraline (ZOLOFT) 25 MG tablet Take 1 tablet (25 mg total) by mouth daily.   [EXPIRED] triamcinolone acetonide (KENALOG-40) injection 40 mg    No facility-administered encounter medications on file as of 07/20/2021.   Past Surgical History:  Procedure Laterality Date   ABDOMINAL HYSTERECTOMY  2009   BLADDER SURGERY  2009   DILATION AND CURETTAGE OF UTERUS  1989   LAPAROSCOPY     NASAL SINUS SURGERY      Vitals:   07/20/21 1423  BP: 118/84  Pulse: 70  Temp: 98 F (36.7 C)  SpO2: 97%   Vitals:   07/20/21 1423  Weight: 198 lb (89.8 kg)  Height: 5\' 8"  (1.727 m)   Body mass index is 30.11 kg/m.  No results found.   Independent interpretation of notes and tests performed by another provider:   None  Procedures performed:   Procedure:  Injection of right knee anteromedial approach. Consent obtained and verified. Skin prepped in a sterile fashion. Ethyl chloride spray for topical local analgesia.  Completed without difficulty and tolerated well. Medication: triamcinolone acetonide 40 mg/mL suspension for injection 1 mL total and 2 mL lidocaine 1% without epinephrine utilized for needle placement anesthetic Advised to contact for fevers/chills, erythema, induration, drainage, or persistent bleeding.   Pertinent History, Exam, Impression, and Recommendations:   Primary osteoarthritis of right knee Unfortunately, patient was unable to obtain previously prescribed Duexis, was able to start physical therapy and  was making initial progress though she began to note symptomatology in her right lateral hip, no groin involvement, there is radiation to the lateral knee, no radiation distally, no paresthesias.  Pain is not associated with snap sensation, aggravated with prolonged weightbearing and strenuous activity.  Physical examination today reveals stable tenderness at the medial joint line, there is tenderness at the lateral joint line, otherwise examination  findings are benign.  Given persistence of symptomatology, response to previous treatments, she did elect to proceed with cortisone injection to the right knee with an anteromedial approach.  Post care reviewed as well as a new referral for physical therapy with new diagnoses.  We will attempt to reach out for viscosupplementation patient assistance once she has completed physical therapy.  She will otherwise return for follow-up in 3 months for clinical reevaluation.  It band syndrome, right  Examination reveals focal tenderness at the greater trochanteric bursa, positive Ober's test, FABER equivocal, FADIR negative, negative straight leg raise. See additional assessment(s) for plan details.    Orders & Medications Meds ordered this encounter  Medications   Diclofenac Sodium 2 % SOLN    Sig: Place 2 sprays onto the skin 2 (two) times daily.    Dispense:  112 g    Refill:  1    Use Programme researcher, broadcasting/film/video for discount. Dx. Osteoarthritis   triamcinolone acetonide (KENALOG-40) injection 40 mg   Orders Placed This Encounter  Procedures   Ambulatory referral to Physical Therapy     Return in about 3 months (around 10/20/2021).     Jerrol Banana, MD   Primary Care Sports Medicine [facility]

## 2021-07-20 NOTE — Patient Instructions (Signed)
You have just been given a cortisone injection to reduce pain and inflammation. After the injection you may notice immediate relief of pain as a result of the Lidocaine. It is important to rest the area of the injection for 24 to 48 hours after the injection. There is a possibility of some temporary increased discomfort and swelling for up to 72 hours until the cortisone begins to work. If you do have pain, simply rest the joint and use ice. If you can tolerate over the counter medications, you can try Tylenol, Aleve, or Advil for added relief per package instructions. - Can use Pennsaid / Duexis as discussed on an as-needed basis - Continue PT (new referral provided) and home exercises - Return in 3 months, contact for questions

## 2021-07-20 NOTE — Assessment & Plan Note (Signed)
Unfortunately, patient was unable to obtain previously prescribed Duexis, was able to start physical therapy and was making initial progress though she began to note symptomatology in her right lateral hip, no groin involvement, there is radiation to the lateral knee, no radiation distally, no paresthesias.  Pain is not associated with snap sensation, aggravated with prolonged weightbearing and strenuous activity.  Physical examination today reveals stable tenderness at the medial joint line, there is tenderness at the lateral joint line, otherwise examination findings are benign.  Given persistence of symptomatology, response to previous treatments, she did elect to proceed with cortisone injection to the right knee with an anteromedial approach.  Post care reviewed as well as a new referral for physical therapy with new diagnoses.  We will attempt to reach out for viscosupplementation patient assistance once she has completed physical therapy.  She will otherwise return for follow-up in 3 months for clinical reevaluation.

## 2021-07-20 NOTE — Assessment & Plan Note (Signed)
Examination reveals focal tenderness at the greater trochanteric bursa, positive Ober's test, FABER equivocal, FADIR negative, negative straight leg raise. See additional assessment(s) for plan details.

## 2021-07-24 ENCOUNTER — Encounter: Payer: Self-pay | Admitting: Internal Medicine

## 2021-07-24 ENCOUNTER — Other Ambulatory Visit: Payer: Self-pay

## 2021-07-24 ENCOUNTER — Ambulatory Visit: Payer: Managed Care, Other (non HMO) | Admitting: Internal Medicine

## 2021-07-24 VITALS — BP 126/74 | HR 71 | Temp 98.2°F | Ht 68.0 in | Wt 196.0 lb

## 2021-07-24 DIAGNOSIS — L282 Other prurigo: Secondary | ICD-10-CM

## 2021-07-24 DIAGNOSIS — W57XXXA Bitten or stung by nonvenomous insect and other nonvenomous arthropods, initial encounter: Secondary | ICD-10-CM

## 2021-07-24 DIAGNOSIS — S80869A Insect bite (nonvenomous), unspecified lower leg, initial encounter: Secondary | ICD-10-CM

## 2021-07-24 MED ORDER — PREDNISONE 10 MG (21) PO TBPK: ORAL_TABLET | ORAL | 0 refills | Status: DC
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
## 2021-07-24 NOTE — Patient Instructions (Signed)
Add Pepcid or Zantac daily to help with itching.

## 2021-07-25 ENCOUNTER — Ambulatory Visit: Payer: Managed Care, Other (non HMO) | Admitting: Physical Therapy

## 2021-07-27 ENCOUNTER — Encounter: Payer: Managed Care, Other (non HMO) | Admitting: Physical Therapy

## 2021-08-01 ENCOUNTER — Encounter: Payer: Managed Care, Other (non HMO) | Admitting: Physical Therapy

## 2021-08-03 ENCOUNTER — Encounter: Payer: Managed Care, Other (non HMO) | Admitting: Physical Therapy

## 2021-08-08 ENCOUNTER — Encounter: Payer: Managed Care, Other (non HMO) | Admitting: Physical Therapy

## 2021-08-10 ENCOUNTER — Encounter: Payer: Managed Care, Other (non HMO) | Admitting: Physical Therapy

## 2021-08-15 ENCOUNTER — Encounter: Payer: Managed Care, Other (non HMO) | Admitting: Physical Therapy

## 2021-08-17 ENCOUNTER — Encounter: Payer: Managed Care, Other (non HMO) | Admitting: Physical Therapy

## 2021-08-22 ENCOUNTER — Encounter: Payer: Managed Care, Other (non HMO) | Admitting: Physical Therapy

## 2021-08-24 ENCOUNTER — Encounter: Payer: Managed Care, Other (non HMO) | Admitting: Physical Therapy

## 2021-10-20 ENCOUNTER — Ambulatory Visit: Payer: Managed Care, Other (non HMO) | Admitting: Internal Medicine

## 2021-10-20 ENCOUNTER — Ambulatory Visit: Payer: Managed Care, Other (non HMO) | Admitting: Family Medicine

## 2021-10-20 ENCOUNTER — Other Ambulatory Visit: Payer: Self-pay

## 2021-10-20 ENCOUNTER — Encounter: Payer: Self-pay | Admitting: Internal Medicine

## 2021-10-20 VITALS — BP 126/70 | HR 97 | Ht 68.0 in | Wt 201.6 lb

## 2021-10-20 DIAGNOSIS — F5105 Insomnia due to other mental disorder: Secondary | ICD-10-CM

## 2021-10-20 DIAGNOSIS — F419 Anxiety disorder, unspecified: Secondary | ICD-10-CM

## 2021-10-20 MED ORDER — ALPRAZOLAM 0.5 MG PO TABS: 0.5000 mg | ORAL_TABLET | Freq: Every evening | ORAL | 0 refills | Status: DC | PRN

## 2021-10-20 MED ORDER — BUPROPION HCL ER (XL) 150 MG PO TB24: 150.0000 mg | ORAL_TABLET | Freq: Every day | ORAL | 1 refills | Status: DC

## 2021-10-20 NOTE — Progress Notes (Signed)
Date:  10/20/2021   Name:  Toni Vang   DOB:  11/09/1966   MRN:  [id number]   Chief Complaint: Anxiety  Anxiety Presents for follow-up visit. Symptoms include dry mouth, excessive worry, insomnia, irritability, nervous/anxious behavior, palpitations and restlessness. Patient reports no chest pain, dizziness, shortness of breath or suicidal ideas. The severity of symptoms is causing significant distress. The quality of sleep is non-restorative. Nighttime awakenings: several.    Insomnia Primary symptoms: sleep disturbance, frequent awakening.   The problem occurs nightly. The problem is unchanged.   Lab Results  Component Value Date   CREATININE 0.64 04/10/2021   BUN 10 04/10/2021   NA 143 04/10/2021   K 4.3 04/10/2021   CL 104 04/10/2021   CO2 24 04/10/2021   Lab Results  Component Value Date   CHOL 278 (H) 04/10/2021   HDL 52 04/10/2021   LDLCALC 171 (H) 04/10/2021   TRIG 292 (H) 04/10/2021   CHOLHDL 5.3 (H) 04/10/2021   Lab Results  Component Value Date   TSH 0.925 04/10/2021   No results found for: HGBA1C Lab Results  Component Value Date   WBC 7.4 04/10/2021   HGB 12.5 04/10/2021   HCT 38.0 04/10/2021   MCV 86 04/10/2021   PLT 325 04/10/2021   Lab Results  Component Value Date   ALT 23 04/10/2021   AST 15 04/10/2021   ALKPHOS 89 04/10/2021   BILITOT <0.2 04/10/2021     Review of Systems  Constitutional:  Positive for irritability. Negative for appetite change and unexpected weight change.  Respiratory:  Negative for choking, chest tightness and shortness of breath.   Cardiovascular:  Positive for palpitations. Negative for chest pain.  Neurological:  Negative for dizziness and headaches.  Psychiatric/Behavioral:  Positive for sleep disturbance. Negative for dysphoric mood and suicidal ideas. The patient is nervous/anxious and has insomnia.    Patient Active Problem List   Diagnosis Date Noted   It band syndrome, right 07/20/2021   Primary  osteoarthritis of right knee 04/13/2021   Hyperlipidemia, mixed 04/11/2021   S/P TAH (total abdominal hysterectomy) 12/06/2020   Environmental and seasonal allergies 12/06/2020   Chronic pain of both knees 12/06/2020   Insomnia secondary to anxiety 12/06/2020   Gastroesophageal reflux disease without esophagitis 08/24/2019   Anxiety 09/16/2018   Vitamin D deficiency 08/22/2015    Allergies  Allergen Reactions   Codeine Itching   Codeine Sulfate Nausea Only   Erythromycin Nausea Only   Latex Rash    Past Surgical History:  Procedure Laterality Date   ABDOMINAL HYSTERECTOMY  2009   BLADDER SURGERY  2009   DILATION AND CURETTAGE OF UTERUS  1989   LAPAROSCOPY     NASAL SINUS SURGERY      Social History   Tobacco Use   Smoking status: Never   Smokeless tobacco: Never  Vaping Use   Vaping Use: Never used  Substance Use Topics   Alcohol use: Not Currently   Drug use: Never     Medication list has been reviewed and updated.  Current Meds  Medication Sig   acetaminophen (TYLENOL) 500 MG tablet Take 1,000 mg by mouth every 6 (six) hours as needed.   Cholecalciferol 125 MCG (5000 UT) TABS Take by mouth.   Diclofenac Sodium 2 % SOLN Place 2 sprays onto the skin 2 (two) times daily.   omeprazole (PRILOSEC) 20 MG capsule Take 40 mg by mouth 2 (two) times daily before a meal.  PHQ 2/9 Scores 10/20/2021 07/24/2021 07/20/2021 06/30/2021  PHQ - 2 Score 1 0 0 0  PHQ- 9 Score 8 0 0 0    GAD 7 : Generalized Anxiety Score 10/20/2021 07/24/2021 07/20/2021 06/30/2021  Nervous, Anxious, on Edge 2 0 0 0  Control/stop worrying 2 0 0 0  Worry too much - different things 2 0 0 0  Trouble relaxing 3 0 0 0  Restless 2 0 0 0  Easily annoyed or irritable 1 0 0 0  Afraid - awful might happen 2 0 0 0  Total GAD 7 Score 14 0 0 0  Anxiety Difficulty Somewhat difficult - Not difficult at all Not difficult at all    BP Readings from Last 3 Encounters:  10/20/21 126/70  07/24/21 126/74   07/20/21 118/84    Physical Exam Vitals and nursing note reviewed.  Constitutional:      General: She is not in acute distress.    Appearance: Normal appearance. She is well-developed.  HENT:     Head: Normocephalic and atraumatic.  Cardiovascular:     Rate and Rhythm: Normal rate and regular rhythm.  Pulmonary:     Effort: Pulmonary effort is normal. No respiratory distress.     Breath sounds: No wheezing or rhonchi.  Musculoskeletal:     Cervical back: Normal range of motion.  Skin:    General: Skin is warm and dry.     Findings: No rash.  Neurological:     Mental Status: She is alert and oriented to person, place, and time.  Psychiatric:        Attention and Perception: Attention normal.        Mood and Affect: Mood normal. Affect is tearful.        Speech: Speech normal.        Behavior: Behavior normal.    Wt Readings from Last 3 Encounters:  10/20/21 201 lb 9.6 oz (91.4 kg)  07/24/21 196 lb (88.9 kg)  07/20/21 198 lb (89.8 kg)    BP 126/70   Pulse 97   Ht 5\' 8"  (1.727 m)   Wt 201 lb 9.6 oz (91.4 kg)   SpO2 96%   BMI 30.65 kg/m   Assessment and Plan: 1. Anxiety Worsened by multiple life stressors (changing jobs, caring for disabled sister , children leaving home, daughters wedding, etc) Poor response to Zoloft and Prozac in the past (decreased libido and lack of efficacy) Will start bupropion - follow up in 6 weeks - buPROPion (WELLBUTRIN XL) 150 MG 24 hr tablet; Take 1 tablet (150 mg total) by mouth daily.  Dispense: 30 tablet; Refill: 1  2. Insomnia secondary to anxiety Can use Xanax prn - ALPRAZolam (XANAX) 0.5 MG tablet; Take 1 tablet (0.5 mg total) by mouth at bedtime as needed for anxiety.  Dispense: 20 tablet; Refill: 0   Partially dictated using Clydie Braun. Any errors are unintentional.  Animal nutritionist, MD [facility]  10/20/2021

## 2021-10-20 NOTE — Progress Notes (Signed)
Date:  10/20/2021   Name:  Toni Vang   DOB:  [birth year]   MRN:  [id number]   Chief Complaint: Anxiety  Anxiety Presents for initial visit. Onset was 1 to 6 months ago. The problem has been gradually worsening. Symptoms include decreased concentration, depressed mood, dry mouth, insomnia, irritability, nervous/anxious behavior, panic and restlessness. Symptoms occur constantly. The symptoms are aggravated by family issues, work stress and social activities.   Risk factors include a major life event.    Lab Results  Component Value Date   CREATININE 0.64 04/10/2021   BUN 10 04/10/2021   NA 143 04/10/2021   K 4.3 04/10/2021   CL 104 04/10/2021   CO2 24 04/10/2021   Lab Results  Component Value Date   CHOL 278 (H) 04/10/2021   HDL 52 04/10/2021   LDLCALC 171 (H) 04/10/2021   TRIG 292 (H) 04/10/2021   CHOLHDL 5.3 (H) 04/10/2021   Lab Results  Component Value Date   TSH 0.925 04/10/2021   No results found for: HGBA1C Lab Results  Component Value Date   WBC 7.4 04/10/2021   HGB 12.5 04/10/2021   HCT 38.0 04/10/2021   MCV 86 04/10/2021   PLT 325 04/10/2021   Lab Results  Component Value Date   ALT 23 04/10/2021   AST 15 04/10/2021   ALKPHOS 89 04/10/2021   BILITOT <0.2 04/10/2021     Review of Systems  Constitutional:  Positive for irritability.  Psychiatric/Behavioral:  Positive for decreased concentration. The patient is nervous/anxious and has insomnia.    Patient Active Problem List   Diagnosis Date Noted  . It band syndrome, right 07/20/2021  . Primary osteoarthritis of right knee 04/13/2021  . Hyperlipidemia, mixed 04/11/2021  . S/P TAH (total abdominal hysterectomy) 12/06/2020  . Environmental and seasonal allergies 12/06/2020  . Chronic pain of both knees 12/06/2020  . 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 Gastroesophageal reflux disease without esophagitis 08/24/2019  . Anxiety 09/16/2018  . Vitamin D deficiency 08/22/2015     Allergies  Allergen Reactions  . Codeine Itching  . Codeine Sulfate Nausea Only  . Erythromycin Nausea Only  . Latex Rash    Past Surgical History:  Procedure Laterality Date  . ABDOMINAL HYSTERECTOMY  2009  . BLADDER SURGERY  2009  . DILATION AND CURETTAGE OF UTERUS  1989  . LAPAROSCOPY    . NASAL SINUS SURGERY      Social History   Tobacco Use  . Smoking status: Never  . Smokeless tobacco: Never  Vaping Use  . Vaping Use: Never used  Substance Use Topics  . Alcohol use: Not Currently  . Drug use: Never     Medication list has been reviewed and updated.  No outpatient medications have been marked as taking for the 10/20/21 encounter (Office Visit) with [name], MD.    [facility] 2/9 Scores 07/24/2021 07/20/2021 06/30/2021 04/13/2021  PHQ - 2 Score 0 0 0 0  PHQ- 9 Score 0 0 0 0    GAD 7 : Generalized Anxiety Score 07/24/2021 07/20/2021 06/30/2021 04/13/2021  Nervous, Anxious, on Edge 0 0 0 1  Control/stop worrying 0 0 0 0  Worry too much - different things 0 0 0 0  Trouble relaxing 0 0 0 0  Restless 0 0 0 0  Easily annoyed or irritable 0 0 0 0  Afraid - awful might happen 0 0 0 0  Total GAD 7 Score 0 0 0 1  Anxiety Difficulty - Not difficult at all Not difficult at all -    BP Readings from Last 3 Encounters:  07/24/21 126/74  07/20/21 118/84  06/30/21 112/70    Physical Exam  Wt Readings from Last 3 Encounters:  07/24/21 196 lb (88.9 kg)  07/20/21 198 lb (89.8 kg)  06/30/21 195 lb (88.5 kg)    There were no vitals taken for this visit.  Assessment and Plan:

## 2021-11-28 ENCOUNTER — Encounter: Payer: Self-pay | Admitting: Internal Medicine

## 2021-11-28 ENCOUNTER — Other Ambulatory Visit
Admission: RE | Admit: 2021-11-28 | Payer: Managed Care, Other (non HMO) | Source: Home / Self Care | Admitting: *Deleted

## 2021-11-28 ENCOUNTER — Ambulatory Visit: Payer: Self-pay

## 2021-11-28 ENCOUNTER — Ambulatory Visit: Payer: Managed Care, Other (non HMO) | Admitting: Internal Medicine

## 2021-11-28 ENCOUNTER — Other Ambulatory Visit: Payer: Self-pay

## 2021-11-28 VITALS — BP 128/82 | HR 82 | Temp 97.8°F | Ht 68.0 in | Wt 200.0 lb

## 2021-11-28 DIAGNOSIS — R1011 Right upper quadrant pain: Secondary | ICD-10-CM | POA: Diagnosis not present

## 2021-11-28 NOTE — Progress Notes (Signed)
Date:  11/28/2021   Name:  Toni Vang   DOB:  07-12-1966   MRN:  [id number]   Chief Complaint: Abdominal Pain  Abdominal Pain This is a new problem. The current episode started in the past 7 days. The problem has been gradually worsening. The pain is located in the epigastric region and RUQ. The quality of the pain is cramping and dull. The abdominal pain radiates to the LUQ. Associated symptoms include belching, constipation, flatus and nausea. Pertinent negatives include no fever, headaches or vomiting. The pain is aggravated by eating. Relieved by: fasting or bland food. She has tried proton pump inhibitors and acetaminophen for the symptoms. The treatment provided mild relief.   Lab Results  Component Value Date   NA 143 04/10/2021   K 4.3 04/10/2021   CO2 24 04/10/2021   GLUCOSE 94 04/10/2021   BUN 10 04/10/2021   CREATININE 0.64 04/10/2021   CALCIUM 9.6 04/10/2021   EGFR 105 04/10/2021   GFRNONAA 102 12/06/2020   Lab Results  Component Value Date   CHOL 278 (H) 04/10/2021   HDL 52 04/10/2021   LDLCALC 171 (H) 04/10/2021   TRIG 292 (H) 04/10/2021   CHOLHDL 5.3 (H) 04/10/2021   Lab Results  Component Value Date   TSH 0.925 04/10/2021   No results found for: HGBA1C Lab Results  Component Value Date   WBC 7.4 04/10/2021   HGB 12.5 04/10/2021   HCT 38.0 04/10/2021   MCV 86 04/10/2021   PLT 325 04/10/2021   Lab Results  Component Value Date   ALT 23 04/10/2021   AST 15 04/10/2021   ALKPHOS 89 04/10/2021   BILITOT <0.2 04/10/2021   Lab Results  Component Value Date   VD25OH 33.8 05/08/2018     Review of Systems  Constitutional:  Positive for fatigue. Negative for chills, fever and unexpected weight change.  Respiratory:  Negative for chest tightness and shortness of breath.   Cardiovascular:  Negative for chest pain and palpitations.  Gastrointestinal:  Positive for abdominal pain, constipation, flatus and nausea. Negative for vomiting.   Neurological:  Negative for dizziness, light-headedness and headaches.   Patient Active Problem List   Diagnosis Date Noted   It band syndrome, right 07/20/2021   Primary osteoarthritis of right knee 04/13/2021   Hyperlipidemia, mixed 04/11/2021   S/P TAH (total abdominal hysterectomy) 12/06/2020   Environmental and seasonal allergies 12/06/2020   Chronic pain of both knees 12/06/2020   Insomnia secondary to anxiety 12/06/2020   Gastroesophageal reflux disease without esophagitis 08/24/2019   Anxiety 09/16/2018   Vitamin D deficiency 08/22/2015    Allergies  Allergen Reactions   Codeine Itching   Codeine Sulfate Nausea Only   Erythromycin Nausea Only   Latex Rash    Past Surgical History:  Procedure Laterality Date   ABDOMINAL HYSTERECTOMY  2009   BLADDER SURGERY  2009   DILATION AND CURETTAGE OF UTERUS  1989   LAPAROSCOPY     NASAL SINUS SURGERY      Social History   Tobacco Use   Smoking status: Never   Smokeless tobacco: Never  Vaping Use   Vaping Use: Never used  Substance Use Topics   Alcohol use: Not Currently   Drug use: Never     Medication list has been reviewed and updated.  Current Meds  Medication Sig   acetaminophen (TYLENOL) 500 MG tablet Take 1,000 mg by mouth every 6 (six) hours as needed.   ALPRAZolam (XANAX) 0.5  MG tablet Take 1 tablet (0.5 mg total) by mouth at bedtime as needed for anxiety.   buPROPion (WELLBUTRIN XL) 150 MG 24 hr tablet Take 1 tablet (150 mg total) by mouth daily.   Cholecalciferol 125 MCG (5000 UT) TABS Take by mouth.   Diclofenac Sodium 2 % SOLN Place 2 sprays onto the skin 2 (two) times daily.   ibuprofen (ADVIL) 200 MG tablet Take 200 mg by mouth every 6 (six) hours as needed.   omeprazole (PRILOSEC) 20 MG capsule Take 40 mg by mouth 2 (two) times daily before a meal.    PHQ 2/9 Scores 11/28/2021 10/20/2021 07/24/2021 07/20/2021  PHQ - 2 Score 0 1 0 0  PHQ- 9 Score 4 8 0 0    GAD 7 : Generalized Anxiety Score  11/28/2021 10/20/2021 07/24/2021 07/20/2021  Nervous, Anxious, on Edge 1 2 0 0  Control/stop worrying 1 2 0 0  Worry too much - different things 0 2 0 0  Trouble relaxing 1 3 0 0  Restless 0 2 0 0  Easily annoyed or irritable 0 1 0 0  Afraid - awful might happen 0 2 0 0  Total GAD 7 Score 3 14 0 0  Anxiety Difficulty Not difficult at all Somewhat difficult - Not difficult at all    BP Readings from Last 3 Encounters:  11/28/21 128/82  10/20/21 126/70  07/24/21 126/74    Physical Exam Vitals and nursing note reviewed.  Constitutional:      General: She is not in acute distress.    Appearance: She is well-developed.  HENT:     Head: Normocephalic and atraumatic.  Pulmonary:     Effort: Pulmonary effort is normal. No respiratory distress.  Abdominal:     General: Bowel sounds are decreased.     Palpations: Abdomen is soft.     Tenderness: There is abdominal tenderness in the right upper quadrant and epigastric area. There is no guarding or rebound. Positive signs include Murphy's sign.  Skin:    General: Skin is warm and dry.     Findings: No rash.  Neurological:     Mental Status: She is alert and oriented to person, place, and time.  Psychiatric:        Mood and Affect: Mood normal.        Behavior: Behavior normal.    Wt Readings from Last 3 Encounters:  11/28/21 200 lb (90.7 kg)  10/20/21 201 lb 9.6 oz (91.4 kg)  07/24/21 196 lb (88.9 kg)    BP 128/82    Pulse 82    Temp 97.8 F (36.6 C) (Oral)    Ht _0  (1.727 m)    Wt 200 lb (90.7 kg)    SpO2 98%    BMI 30.41 kg/m   Assessment and Plan: 1. Colicky RUQ abdominal pain Suspect gall bladder disease. Recommend labs and [place] H Pylori to rule out gastritis/ulcers Continue omeprazole. Low fat, bland diet. Go to ED if symptoms become severe - US Abdomen Limited RUQ (LIVER/GB) - Comprehensive metabolic panel - H. pylori breath test   Partially dictated using Editor, commissioning. Any errors are  unintentional.  Halina Maidens, MD [facility]  11/28/2021

## 2021-11-28 NOTE — Telephone Encounter (Signed)
°  Chief Complaint: Abdominal pai Symptoms: Pain Frequency: Started 5 days ago Pertinent Negatives: Patient denies vomiting, diarrhea Disposition: [] ED /[] Urgent Care (no appt availability in office) / [x] Appointment(In office/virtual)/ []  [facility]/ [] Home Care/ [] Refused Recommended Disposition  Additional Notes: Instructed to call back if symptoms worsen.   Reason for Disposition  [1] MODERATE pain (e.g., interferes with normal activities) AND [2] pain comes and goes (cramps) AND [3] present > 24 hours  (Exception: pain with Vomiting or Diarrhea - see that Guideline)  Answer Assessment - Initial Assessment Questions 1. LOCATION: "Where does it hurt?"      Above belly button on left 2. RADIATION: "Does the pain shoot anywhere else?" (e.g., chest, back)     No 3. ONSET: "When did the pain begin?" (e.g., minutes, hours or days ago)      5 days ago 4. SUDDEN: "Gradual or sudden onset?"     Gradual 5. PATTERN "Does the pain come and go, or is it constant?"    - If constant: "Is it getting better, staying the same, or worsening?"      (Note: Constant means the pain never goes away completely; most serious pain is constant and it progresses)     - If intermittent: "How long does it last?" "Do you have pain now?"     (Note: Intermittent means the pain goes away completely between bouts)     Comes and goes 6. SEVERITY: "How bad is the pain?"  (e.g., Scale 1-10; mild, moderate, or severe)   - MILD (1-3): doesn't interfere with normal activities, abdomen soft and not tender to touch    - MODERATE (4-7): interferes with normal activities or awakens from sleep, abdomen tender to touch    - SEVERE (8-10): excruciating pain, doubled over, unable to do any normal activities      Now - 1 7. RECURRENT SYMPTOM: "Have you ever had this type of stomach pain before?" If Yes, ask: "When was the last time?" and "What happened that time?"      No 8. CAUSE: "What do you think is causing the  stomach pain?"     Related to diet 9. RELIEVING/AGGRAVATING FACTORS: "What makes it better or worse?" (e.g., movement, antacids, bowel movement)     No 10. OTHER SYMPTOMS: "Do you have any other symptoms?" (e.g., back pain, diarrhea, fever, urination pain, vomiting)       No 11. PREGNANCY: "Is there any chance you are pregnant?" "When was your last menstrual period?"       no  Protocols used: Abdominal Pain - [facility]

## 2021-11-29 LAB — COMPREHENSIVE METABOLIC PANEL
ALT: 16 IU/L (ref 0–32)
AST: 18 IU/L (ref 0–40)
Albumin/Globulin Ratio: 1.6 (ref 1.2–2.2)
Albumin: 4.6 g/dL (ref 3.8–4.9)
Alkaline Phosphatase: 109 IU/L (ref 44–121)
BUN/Creatinine Ratio: 10 (ref 9–23)
BUN: 7 mg/dL (ref 6–24)
Bilirubin Total: 0.2 mg/dL (ref 0.0–1.2)
CO2: 26 mmol/L (ref 20–29)
Calcium: 9.6 mg/dL (ref 8.7–10.2)
Chloride: 103 mmol/L (ref 96–106)
Creatinine, Ser: 0.67 mg/dL (ref 0.57–1.00)
Globulin, Total: 2.8 g/dL (ref 1.5–4.5)
Glucose: 82 mg/dL (ref 70–99)
Potassium: 5.3 mmol/L — ABNORMAL HIGH (ref 3.5–5.2)
Sodium: 140 mmol/L (ref 134–144)
Total Protein: 7.4 g/dL (ref 6.0–8.5)
eGFR: 103 mL/min/{1.73_m2} (ref >59)

## 2021-11-29 LAB — H. PYLORI BREATH TEST: H pylori Breath Test: NEGATIVE

## 2021-12-01 ENCOUNTER — Ambulatory Visit: Payer: Managed Care, Other (non HMO)

## 2021-12-06 ENCOUNTER — Other Ambulatory Visit: Payer: Self-pay

## 2021-12-06 ENCOUNTER — Ambulatory Visit
Admission: RE | Admit: 2021-12-06 | Discharge: 2021-12-06 | Disposition: A | Discharge status: Home / Self Care | Payer: Managed Care, Other (non HMO) | Source: Ambulatory Visit | Attending: Internal Medicine | Admitting: Internal Medicine

## 2021-12-06 DIAGNOSIS — R1011 Right upper quadrant pain: Secondary | ICD-10-CM | POA: Insufficient documentation

## 2021-12-13 ENCOUNTER — Ambulatory Visit: Payer: Managed Care, Other (non HMO) | Admitting: Internal Medicine

## 2021-12-18 ENCOUNTER — Other Ambulatory Visit: Payer: Self-pay

## 2021-12-18 ENCOUNTER — Encounter: Payer: Self-pay | Admitting: Internal Medicine

## 2021-12-18 ENCOUNTER — Ambulatory Visit: Payer: Managed Care, Other (non HMO) | Admitting: Internal Medicine

## 2021-12-18 VITALS — BP 138/78 | HR 88 | Ht 68.0 in | Wt 195.4 lb

## 2021-12-18 DIAGNOSIS — R1011 Right upper quadrant pain: Secondary | ICD-10-CM

## 2021-12-18 MED ORDER — DICYCLOMINE HCL 10 MG PO CAPS: 10.0000 mg | ORAL_CAPSULE | Freq: Three times a day (TID) | ORAL | 0 refills | Status: DC

## 2021-12-18 NOTE — Progress Notes (Signed)
Date:  12/18/2021   Name:  Toni Vang   DOB:  25-Jun-1966   MRN:  [id number]   Chief Complaint: Hospitalization Follow-up  CBC with slightly incr WBC no left shift; CMP normal, Lipase normal, UA negative.  Abdominal Pain This is a recurrent problem. The current episode started 1 to 4 weeks ago. The pain is located in the RUQ. The pain is mild. The quality of the pain is colicky. Associated symptoms include constipation and diarrhea. Pertinent negatives include no fever or vomiting. Prior workup: US done 12/28 was nonrevealing.  Went to ED yesterday [place] still normal.   U/S 12/17/21: Findings:  Exam of the above structures revealed the following findings: Gallstones: Absent Sludge: Absent Sonographic Murphy: Absent Pericholecystic fluid: Absent Intrahepatic cholestasis: Absent GB wall thickness: Normal       Maximal GB wall thickness in transverse plane: <3 millimeters Common bile duct diameter: Normal       CBD diameter: <4 millimeters Other findings: mild distension but ULN Limitations: None. Impression: Normal gallbladder ultrasound  PA chest and KUB: TECHNIQUE: PA view of the chest as well as upright and supine views of the abdomen. FINDINGS:  Cardiomediastinal silhouette is normal. The lungs are clear. No pneumoperitoneum. Small amount of bowel gas is noted in nondilated, nonobstructed small and large bowel. No acute osseous abnormalities. Small colonic stool burden.    Lab Results  Component Value Date   NA 140 11/28/2021   K 5.3 (H) 11/28/2021   CO2 26 11/28/2021   GLUCOSE 82 11/28/2021   BUN 7 11/28/2021   CREATININE 0.67 11/28/2021   CALCIUM 9.6 11/28/2021   EGFR 103 11/28/2021   GFRNONAA 102 12/06/2020   Lab Results  Component Value Date   CHOL 278 (H) 04/10/2021   HDL 52 04/10/2021   LDLCALC 171 (H) 04/10/2021   TRIG 292 (H) 04/10/2021   CHOLHDL 5.3 (H) 04/10/2021   Lab Results  Component Value Date   TSH 0.925 04/10/2021   No results found for:  HGBA1C Lab Results  Component Value Date   WBC 7.4 04/10/2021   HGB 12.5 04/10/2021   HCT 38.0 04/10/2021   MCV 86 04/10/2021   PLT 325 04/10/2021   Lab Results  Component Value Date   ALT 16 11/28/2021   AST 18 11/28/2021   ALKPHOS 109 11/28/2021   BILITOT 0.2 11/28/2021   Lab Results  Component Value Date   VD25OH 33.8 05/08/2018     Review of Systems  Constitutional:  Negative for appetite change, fatigue, fever and unexpected weight change.  HENT:  Positive for trouble swallowing.   Respiratory:  Negative for cough, chest tightness and shortness of breath.   Gastrointestinal:  Positive for abdominal pain, constipation and diarrhea. Negative for blood in stool and vomiting.  Psychiatric/Behavioral:  Negative for dysphoric mood and sleep disturbance. The patient is not nervous/anxious.    Patient Active Problem List   Diagnosis Date Noted   It band syndrome, right 07/20/2021   Primary osteoarthritis of right knee 04/13/2021   Hyperlipidemia, mixed 04/11/2021   S/P TAH (total abdominal hysterectomy) 12/06/2020   Environmental and seasonal allergies 12/06/2020   Chronic pain of both knees 12/06/2020   Insomnia secondary to anxiety 12/06/2020   Gastroesophageal reflux disease without esophagitis 08/24/2019   Anxiety 09/16/2018   Vitamin D deficiency 08/22/2015    Allergies  Allergen Reactions   Codeine Itching   Codeine Sulfate Nausea Only   Erythromycin Nausea Only   Latex Rash  Past Surgical History:  Procedure Laterality Date   ABDOMINAL HYSTERECTOMY  2009   BLADDER SURGERY  2009   DILATION AND CURETTAGE OF UTERUS  1989   LAPAROSCOPY     NASAL SINUS SURGERY      Social History   Tobacco Use   Smoking status: Never   Smokeless tobacco: Never  Vaping Use   Vaping Use: Never used  Substance Use Topics   Alcohol use: Not Currently   Drug use: Never     Medication list has been reviewed and updated.  Current Meds  Medication Sig    acetaminophen (TYLENOL) 500 MG tablet Take 1,000 mg by mouth every 6 (six) hours as needed.   ALPRAZolam (XANAX) 0.5 MG tablet Take 1 tablet (0.5 mg total) by mouth at bedtime as needed for anxiety.   buPROPion (WELLBUTRIN XL) 150 MG 24 hr tablet Take 1 tablet (150 mg total) by mouth daily.   Cholecalciferol 125 MCG (5000 UT) TABS Take by mouth.   Diclofenac Sodium 2 % SOLN Place 2 sprays onto the skin 2 (two) times daily.   ibuprofen (ADVIL) 200 MG tablet Take 200 mg by mouth every 6 (six) hours as needed.   omeprazole (PRILOSEC) 20 MG capsule Take 40 mg by mouth 2 (two) times daily before a meal.   sucralfate (CARAFATE) 1 g tablet Take 1 g by mouth 4 (four) times daily -  with meals and at bedtime.    PHQ 2/9 Scores 11/28/2021 10/20/2021 07/24/2021 07/20/2021  PHQ - 2 Score 0 1 0 0  PHQ- 9 Score 4 8 0 0    GAD 7 : Generalized Anxiety Score 11/28/2021 10/20/2021 07/24/2021 07/20/2021  Nervous, Anxious, on Edge 1 2 0 0  Control/stop worrying 1 2 0 0  Worry too much - different things 0 2 0 0  Trouble relaxing 1 3 0 0  Restless 0 2 0 0  Easily annoyed or irritable 0 1 0 0  Afraid - awful might happen 0 2 0 0  Total GAD 7 Score 3 14 0 0  Anxiety Difficulty Not difficult at all Somewhat difficult - Not difficult at all    BP Readings from Last 3 Encounters:  12/18/21 138/78  11/28/21 128/82  10/20/21 126/70    Physical Exam Vitals and nursing note reviewed.  Constitutional:      General: She is not in acute distress.    Appearance: Normal appearance. She is well-developed.  HENT:     Head: Normocephalic and atraumatic.  Cardiovascular:     Rate and Rhythm: Normal rate and regular rhythm.  Pulmonary:     Effort: Pulmonary effort is normal. No respiratory distress.     Breath sounds: No wheezing or rhonchi.  Abdominal:     General: Bowel sounds are decreased.     Palpations: Abdomen is soft. There is no mass.     Tenderness: There is abdominal tenderness in the epigastric area.  There is no right CVA tenderness, left CVA tenderness or guarding.  Skin:    General: Skin is warm and dry.     Findings: No rash.  Neurological:     Mental Status: She is alert and oriented to person, place, and time.  Psychiatric:        Mood and Affect: Mood normal.        Behavior: Behavior normal.    Wt Readings from Last 3 Encounters:  12/18/21 195 lb 6.1 oz (88.6 kg)  11/28/21 200 lb (90.7 kg)  10/20/21 201 lb 9.6 oz (91.4 kg)    BP 138/78    Pulse 88    Ht _0  (1.727 m)    Wt 195 lb 6.1 oz (88.6 kg)    SpO2 98%    BMI 29.71 kg/m   Assessment and Plan: 1. Colicky RUQ abdominal pain [place] has been normal.  New sx of dysphagia with food sticking. Continue omprazole but increase to bid. Continue Carafate qid Add Bentyl qid and refer to GI - dicyclomine (BENTYL) 10 MG capsule; Take 1 capsule (10 mg total) by mouth 4 (four) times daily -  before meals and at bedtime.  Dispense: 90 capsule; Refill: 0 - Ambulatory referral to Gastroenterology   Partially dictated using Hayes. Any errors are unintentional.  Halina Maidens, MD [facility]  12/18/2021

## 2022-02-07 ENCOUNTER — Ambulatory Visit: Payer: Managed Care, Other (non HMO) | Admitting: Gastroenterology

## 2022-02-12 ENCOUNTER — Ambulatory Visit: Payer: Managed Care, Other (non HMO) | Admitting: Gastroenterology

## 2022-04-23 ENCOUNTER — Encounter: Payer: Self-pay | Admitting: Gastroenterology

## 2022-04-23 ENCOUNTER — Ambulatory Visit: Payer: Managed Care, Other (non HMO) | Admitting: Gastroenterology

## 2022-04-23 ENCOUNTER — Other Ambulatory Visit: Payer: Self-pay

## 2022-04-23 VITALS — BP 163/87 | HR 72 | Temp 98.7°F | Ht 68.0 in | Wt 200.2 lb

## 2022-04-23 DIAGNOSIS — Z1211 Encounter for screening for malignant neoplasm of colon: Secondary | ICD-10-CM

## 2022-04-23 DIAGNOSIS — K219 Gastro-esophageal reflux disease without esophagitis: Secondary | ICD-10-CM

## 2022-04-23 MED ORDER — NA SULFATE-K SULFATE-MG SULF 17.5-3.13-1.6 GM/177ML PO SOLN: 354.0000 mL | Freq: Once | ORAL | 0 refills | Status: AC

## 2022-04-23 NOTE — Progress Notes (Signed)
ne

## 2022-04-23 NOTE — Progress Notes (Signed)
?  ?[name], MD ?[address]  ?[address]  ?[place], [place] [address]  ?Main: [phone]  ?Fax: [phone] ? ? ? ?Gastroenterology Consultation ? ?Referring Provider:     [name], MD ?Primary Care Physician:  Toni Milan, MD ?Primary Gastroenterologist:  Dr. [name] ?Reason for Consultation:     Chronic GERD, colon cancer screening ?      ? HPI:   ?Toni Vang is a 56 y.o. female referred by Dr. Judithann Vang, Toni Cowden, MD  for consultation & management of chronic GERD and colon cancer screening.  Patient reports that she has been experiencing heartburn for several years, has been maintained on omeprazole 20 mg twice daily.  She does report nocturnal heartburn, uses 2 pillows to keep the head of the bed elevated.  Patient has significant stress in her life due to loss of multiple family members of her husband.  And, she has been gaining weight.  Patient is also interested to undergo upper endoscopy as well as colonoscopy ? ?Patient does not smoke or drink alcohol.  She is diagnosed with irritable bowel syndrome, stress triggers diarrhea and sometimes bloating ? ?NSAIDs: None ? ?Antiplts/Anticoagulants/Anti thrombotics: None ? ?GI Procedures: None ?Denies any family history of GI malignancy ? ?Past Medical History:  ?Diagnosis Date  ? Acid reflux   ? Arthritis   ? 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 Hypertension   ? Personal history of chemotherapy   ? ? ?Past Surgical History:  ?Procedure Laterality Date  ? BLADDER SURGERY  2009  ? DILATION AND CURETTAGE OF UTERUS  1989  ? LAPAROSCOPY    ? NASAL SINUS SURGERY    ? TOTAL ABDOMINAL HYSTERECTOMY  2009  ? ? ?Current Outpatient Medications:  ?  acetaminophen (TYLENOL) 500 MG tablet, Take 1,000 mg by mouth every 6 (six) hours as needed., Disp: , Rfl:  ?  ALPRAZolam (XANAX) 0.5 MG tablet, Take 1 tablet (0.5 mg total) by mouth at bedtime as needed for anxiety., Disp: 20 tablet, Rfl: 0 ?  buPROPion (WELLBUTRIN XL) 150 MG 24 hr tablet,  Take 1 tablet (150 mg total) by mouth daily., Disp: 30 tablet, Rfl: 1 ?  Cholecalciferol 125 MCG (5000 UT) TABS, Take by mouth., Disp: , Rfl:  ?  Diclofenac Sodium 2 % SOLN, Place 2 sprays onto the skin 2 (two) times daily., Disp: 112 g, Rfl: 1 ?  dicyclomine (BENTYL) 10 MG capsule, Take 1 capsule (10 mg total) by mouth 4 (four) times daily -  before meals and at bedtime., Disp: 90 capsule, Rfl: 0 ?  ibuprofen (ADVIL) 200 MG tablet, Take 200 mg by mouth every 6 (six) hours as needed., Disp: , Rfl:  ?  Na Sulfate-K Sulfate-Mg Sulf 17.5-3.13-1.6 GM/177ML SOLN, Take 354 mLs by mouth once for 1 dose., Disp: 354 mL, Rfl: 0 ?  omeprazole (PRILOSEC) 20 MG capsule, Take 40 mg by mouth 2 (two) times daily before a meal., Disp: , Rfl:  ? ? ? ?Family History  ?Problem Relation Age of Onset  ? Diabetes Mother   ? Hypertension Mother   ? Breast cancer Mother 53  ? Dementia Mother   ? Hypertension Father   ? Cancer Maternal Aunt   ?     ovarian  ? Diabetes Maternal Grandmother   ? Diabetes Maternal Grandfather   ?  ? ?Social History  ? ?Tobacco Use  ? Smoking status: Never  ? Smokeless tobacco: Never  ?Vaping  Use  ? Vaping Use: Never used  ?Substance Use Topics  ? Alcohol use: Not Currently  ? Drug use: Never  ? ? ?Allergies as of 04/23/2022 - Review Complete 04/23/2022  ?Allergen Reaction Noted  ? Codeine Itching 02/11/2019  ? Codeine sulfate Nausea Only 06/02/2015  ? Erythromycin Nausea Only 06/02/2015  ? Latex Rash 06/06/2015  ? ? ?Review of Systems:    ?All systems reviewed and negative except where noted in HPI. ? ? Physical Exam:  ?BP (!) 163/87 (BP Location: Left Arm, Patient Position: Sitting, Cuff Size: Normal)   Pulse 72   Temp 98.7 ?F (37.1 ?C) (Oral)   Ht 5\' 8"  (1.727 m)   Wt 200 lb 4 oz (90.8 kg)   BMI 30.45 kg/m?  ?No LMP recorded. Patient has had a hysterectomy. ? ?General:   Alert,  Well-developed, well-nourished, pleasant and cooperative in NAD ?Head:  Normocephalic and atraumatic. ?Eyes:  Sclera clear, no  icterus.   Conjunctiva pink. ?Ears:  Normal auditory acuity. ?Nose:  No deformity, discharge, or lesions. ?Mouth:  No deformity or lesions,oropharynx pink & moist. ?Neck:  Supple; no masses or thyromegaly. ?Lungs:  Respirations even and unlabored.  Clear throughout to auscultation.   No wheezes, crackles, or rhonchi. No acute distress. ?Heart:  Regular rate and rhythm; no murmurs, clicks, rubs, or gallops. ?Abdomen:  Normal bowel sounds. Soft, non-tender and non-distended without masses, hepatosplenomegaly or hernias noted.  No guarding or rebound tenderness.   ?Rectal: Not performed ?Msk:  Symmetrical without gross deformities. Good, equal movement & strength bilaterally. ?Pulses:  Normal pulses noted. ?Extremities:  No clubbing or edema.  No cyanosis. ?Neurologic:  Alert and oriented x3;  grossly normal neurologically. ?Skin:  Intact without significant lesions or rashes. No jaundice. ?Psych:  Alert and cooperative. Normal mood and affect. ? ?Imaging Studies: ?Reviewed ? ?Assessment and Plan:  ? ?MURIAH Vang is a 56 y.o. female with BMI 30, is seen in consultation for chronic GERD maintained on long-term PPI and to discuss about colonoscopy for colon cancer screening ? ?Chronic GERD ?Continue omeprazole 20 mg p.o. twice daily ?Continue antireflux lifestyle ?Recommend EGD for further evaluation ? ?Colon cancer screening ?Recommend colonoscopy ? ?I have discussed alternative options, risks & benefits,  which include, but are not limited to, bleeding, infection, perforation,respiratory complication & drug reaction.  The patient agrees with this plan & written consent will be obtained.   ? ? ?Follow up based on the above work-up ? ? ?53, MD ? ?

## 2022-05-25 ENCOUNTER — Encounter: Admission: RE | Payer: Self-pay | Source: Home / Self Care

## 2022-05-25 ENCOUNTER — Ambulatory Visit
Admission: RE | Admit: 2022-05-25 | Payer: Managed Care, Other (non HMO) | Source: Home / Self Care | Admitting: Gastroenterology

## 2022-05-25 SURGERY — COLONOSCOPY WITH PROPOFOL
Anesthesia: General

## 2022-07-05 ENCOUNTER — Encounter: Payer: Self-pay | Admitting: Internal Medicine

## 2022-07-05 ENCOUNTER — Ambulatory Visit: Payer: Managed Care, Other (non HMO) | Admitting: Internal Medicine

## 2022-07-05 VITALS — BP 134/78 | HR 87 | Ht 68.0 in | Wt 199.0 lb

## 2022-07-05 DIAGNOSIS — I10 Essential (primary) hypertension: Secondary | ICD-10-CM

## 2022-07-05 DIAGNOSIS — N951 Menopausal and female climacteric states: Secondary | ICD-10-CM

## 2022-07-05 MED ORDER — VENLAFAXINE HCL ER 37.5 MG PO CP24: 37.5000 mg | ORAL_CAPSULE | Freq: Every day | ORAL | 0 refills | Status: DC

## 2022-07-05 MED ORDER — HYDROCHLOROTHIAZIDE 25 MG PO TABS: 25.0000 mg | ORAL_TABLET | Freq: Every day | ORAL | 0 refills | Status: DC

## 2022-07-05 NOTE — Progress Notes (Signed)
Date:  07/05/2022   Name:  Toni Vang   DOB:  05/10/1966   MRN:  [id number]   Chief Complaint: Hypertension  Hypertension This is a new problem. The current episode started more than 1 month ago. The problem has been waxing and waning since onset. Associated symptoms include peripheral edema. Pertinent negatives include no chest pain, headaches, orthopnea, palpitations or shortness of breath. There are no associated agents to hypertension. Past treatments include nothing.  Menopause - she is still having issues with hot flashes, weight gain, poor sleep and anxiety.  She has hx of choriocarcinoma of uterus at age 56 and GYN says she is not a candidate for HRT.  Lab Results  Component Value Date   NA 140 11/28/2021   K 5.3 (H) 11/28/2021   CO2 26 11/28/2021   GLUCOSE 82 11/28/2021   BUN 7 11/28/2021   CREATININE 0.67 11/28/2021   CALCIUM 9.6 11/28/2021   EGFR 103 11/28/2021   GFRNONAA 102 12/06/2020   Lab Results  Component Value Date   CHOL 278 (H) 04/10/2021   HDL 52 04/10/2021   LDLCALC 171 (H) 04/10/2021   TRIG 292 (H) 04/10/2021   CHOLHDL 5.3 (H) 04/10/2021   Lab Results  Component Value Date   TSH 0.925 04/10/2021   No results found for: "HGBA1C" Lab Results  Component Value Date   WBC 7.4 04/10/2021   HGB 12.5 04/10/2021   HCT 38.0 04/10/2021   MCV 86 04/10/2021   PLT 325 04/10/2021   Lab Results  Component Value Date   ALT 16 11/28/2021   AST 18 11/28/2021   ALKPHOS 109 11/28/2021   BILITOT 0.2 11/28/2021   Lab Results  Component Value Date   VD25OH 33.8 05/08/2018     Review of Systems  Constitutional:  Positive for unexpected weight change. Negative for chills, fatigue and fever.  Respiratory:  Negative for chest tightness and shortness of breath.   Cardiovascular:  Negative for chest pain, palpitations and orthopnea.  Gastrointestinal:  Negative for abdominal pain.  Musculoskeletal:  Positive for myalgias. Negative for arthralgias.   Neurological:  Negative for dizziness and headaches.  Psychiatric/Behavioral:  Positive for sleep disturbance. Negative for dysphoric mood. The patient is nervous/anxious.     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   Gastroesophageal reflux disease without esophagitis 08/24/2019   Anxiety 09/16/2018   Vitamin D deficiency 08/22/2015    Allergies  Allergen Reactions   Codeine Itching   Codeine Sulfate Nausea Only   Erythromycin Nausea Only   Latex Rash    Past Surgical History:  Procedure Laterality Date   BLADDER SURGERY  2009   DILATION AND CURETTAGE OF UTERUS  1989   LAPAROSCOPY     NASAL SINUS SURGERY     TOTAL ABDOMINAL HYSTERECTOMY  2009    Social History   Tobacco Use   Smoking status: Never   Smokeless tobacco: Never  Vaping Use   Vaping Use: Never used  Substance Use Topics   Alcohol use: Not Currently   Drug use: Never     Medication list has been reviewed and updated.  Current Meds  Medication Sig   acetaminophen (TYLENOL) 500 MG tablet Take 1,000 mg by mouth every 6 (six) hours as needed.   Cholecalciferol 125 MCG (5000 UT) TABS Take by mouth.   hydrochlorothiazide (HYDRODIURIL) 25 MG tablet Take 1 tablet (25 mg total) by mouth daily.   ibuprofen (ADVIL) 200 MG tablet Take 200 mg by mouth every 6 (six) hours as needed.   omeprazole (PRILOSEC) 20 MG capsule Take 40 mg by mouth 2 (two) times daily before a meal.   venlafaxine XR (EFFEXOR XR) 37.5 MG 24 hr capsule Take 1 capsule (37.5 mg total) by mouth daily with breakfast.   [DISCONTINUED] Diclofenac Sodium 2 % SOLN  Place 2 sprays onto the skin 2 (two) times daily.       07/05/2022    8:19 AM 12/18/2021    3:09 PM 11/28/2021    3:35 PM 10/20/2021    3:37 PM  GAD 7 : Generalized Anxiety Score  Nervous, Anxious, on Edge 1 0 1 2  Control/stop worrying 2 0 1 2  Worry too much - different things 2 0 0 2  Trouble relaxing 1 0 1 3  Restless 1 0 0 2  Easily annoyed or irritable 1 0 0 1  Afraid - awful might happen 2 0 0 2  Total GAD 7 Score 10 0 3 14  Anxiety Difficulty Somewhat difficult Not difficult at all Not difficult at all Somewhat difficult       07/05/2022    8:19 AM 12/18/2021    3:08 PM 11/28/2021    3:35 PM  Depression screen PHQ 2/9  Decreased Interest 0 0 0  Down, Depressed, Hopeless 0 0 0  PHQ - 2 Score 0 0 0  Altered sleeping 2 1 2   Tired, decreased energy 2 1 2   Change in appetite 0 1 0  Feeling bad or failure about yourself  0 0 0  Trouble concentrating 0 0 0  Moving slowly or fidgety/restless 0 0 0  Suicidal thoughts 0 0 0  PHQ-9 Score 4 3 4   Difficult doing work/chores Not difficult at all Not difficult at all Not difficult at all    BP Readings from Last 3 Encounters:  07/05/22 134/78  04/23/22 (!) 163/87  12/18/21 138/78    Physical Exam Vitals and nursing note reviewed.  Constitutional:      General: She is not in acute distress.    Appearance: Normal appearance. She is well-developed.  HENT:     Head: Normocephalic and atraumatic.  Cardiovascular:     Rate and Rhythm: Normal rate and regular rhythm.     Pulses: Normal pulses.  Pulmonary:     Effort: Pulmonary effort is normal. No respiratory distress.     Breath sounds: No wheezing or rhonchi.  Musculoskeletal:     Right lower leg: No edema.     Left lower leg: No edema.  Skin:    General: Skin is warm and dry.     Capillary Refill: Capillary refill takes less than 2 seconds.     Findings: No rash.  Neurological:     General: No focal deficit present.     Mental Status: She is alert and oriented to  person, place, and time.  Psychiatric:        Mood and Affect: Mood normal.        Behavior: Behavior normal.     Wt Readings from Last 3 Encounters:  07/05/22 199 lb (90.3 kg)  04/23/22 200  lb 4 oz (90.8 kg)  12/18/21 195 lb 6.1 oz (88.6 kg)    BP 134/78   Pulse 87   Ht 5' 8"  (1.727 m)   Wt 199 lb (90.3 kg)   SpO2 95%   BMI 30.26 kg/m   Assessment and Plan: 1. Essential hypertension New onset - waxing and waning but generally around 145 at home. Triggered by anxiety as well.  Family hx of anxiety and early stroke. Will start HCTZ since she has some LE edema at the end of the day. DASH diet  Follow up in 2 months - hydrochlorothiazide (HYDRODIURIL) 25 MG tablet; Take 1 tablet (25 mg total) by mouth daily.  Dispense: 90 tablet; Refill: 0  2. Menopause syndrome Not a candidate for HRT Has not tried other medications - recommend Effexor to help with hot flashes, mood, sleep without causing more weight gain. - venlafaxine XR (EFFEXOR XR) 37.5 MG 24 hr capsule; Take 1 capsule (37.5 mg total) by mouth daily with breakfast.  Dispense: 90 capsule; Refill: 0   Partially dictated using Editor, commissioning. Any errors are unintentional.  Halina Maidens, MD [facility]  07/05/2022

## 2022-07-20 ENCOUNTER — Ambulatory Visit: Payer: Self-pay

## 2022-07-20 ENCOUNTER — Other Ambulatory Visit: Payer: Self-pay

## 2022-07-20 MED ORDER — FLUCONAZOLE 200 MG PO TABS: 200.0000 mg | ORAL_TABLET | Freq: Every day | ORAL | 0 refills | Status: DC

## 2022-07-20 NOTE — Telephone Encounter (Signed)
Called and spoke with patient. Informed I sent in diflucan to her pharmacy. Told her if symptoms do not get better then she will need to come in next week at appt for check up. She verbalized understanding.

## 2022-07-20 NOTE — Telephone Encounter (Signed)
Pt stated possible yeast infection experiencing swelling mentioned has used Monistat has helped with itching but its now burning.  Symptoms started on Monday. Pt is requesting medication is not wanting to come in at this time as its her second day at a new job.   Pt denied abdominal pain.   Pt seeking clinical advice.    Chief Complaint: Yeast infection, itching,white discharge,swelling. Had a UTI and had been on antibiotic. Tried OTC Monistat, did not help. Cannot come in due to new job. Asking for medication to be sent to pharmacy. Symptoms: Above Frequency: Monday Pertinent Negatives: Patient denies fever Disposition: [] ED /[] Urgent Care (no appt availability in office) / [] Appointment(In office/virtual)/ []  [facility]/ [] Home Care/ [] Refused Recommended Disposition /[] [facility]/ [x]  Follow-up with PCP Additional Notes: Please advise pt.   Answer Assessment - Initial Assessment Questions 1. SYMPTOM: "What's the main symptom you're concerned about?" (e.g., pain, itching, dryness)     Swelling,itching, white discharge 2. LOCATION: "Where is the   located?" (e.g., inside/outside, left/right)     Outside 3. ONSET: "When did the    start?"     Monday 4. PAIN: "Is there any pain?" If Yes, ask: "How bad is it?" (Scale: 1-10; mild, moderate, severe)   -  MILD (1-3): Doesn't interfere with normal activities.    -  MODERATE (4-7): Interferes with normal activities (e.g., work or school) or awakens from sleep.     -  SEVERE (8-10): Excruciating pain, unable to do any normal activities.     Mild 5. ITCHING: "Is there any itching?" If Yes, ask: "How bad is it?" (Scale: 1-10; mild, moderate, severe)     Yes 6. CAUSE: "What do you think is causing the discharge?" "Have you had the same problem before? What happened then?"     Yeast, had been on antibiotic 7. OTHER SYMPTOMS: "Do you have any other symptoms?" (e.g., fever, itching, vaginal bleeding, pain with urination,  injury to genital area, vaginal foreign body)     Swelling 8. PREGNANCY: "Is there any chance you are pregnant?" "When was your last menstrual period?"     No  Protocols used: Vaginal Symptoms-A-AH

## 2022-09-05 ENCOUNTER — Ambulatory Visit: Payer: Managed Care, Other (non HMO) | Admitting: Internal Medicine

## 2022-09-18 IMAGING — US US ABDOMEN LIMITED
1 series · 14 of 25 positions shown · non-contrast
Comparison: None.

CLINICAL DATA: Colicky abdominal pain.

EXAM:
ULTRASOUND ABDOMEN LIMITED RIGHT UPPER QUADRANT

[Series 1: us abdomen limited · 0.22mm/px · 14 of 52 slices shown]
[im 1/52]
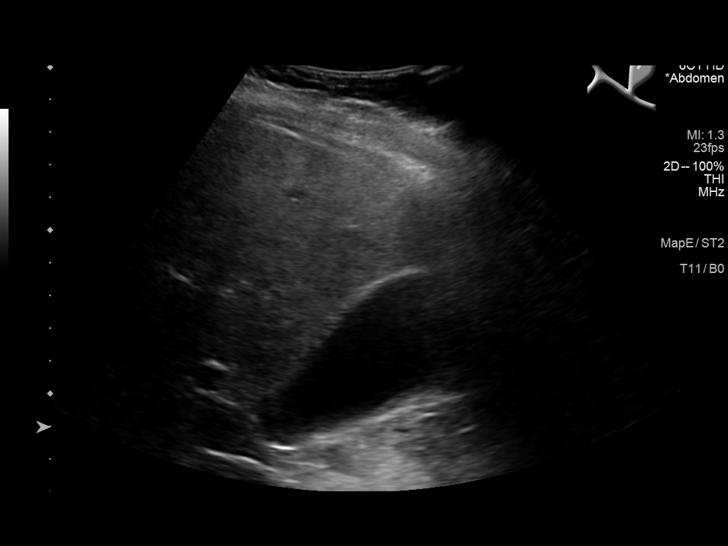
[im 5/52]
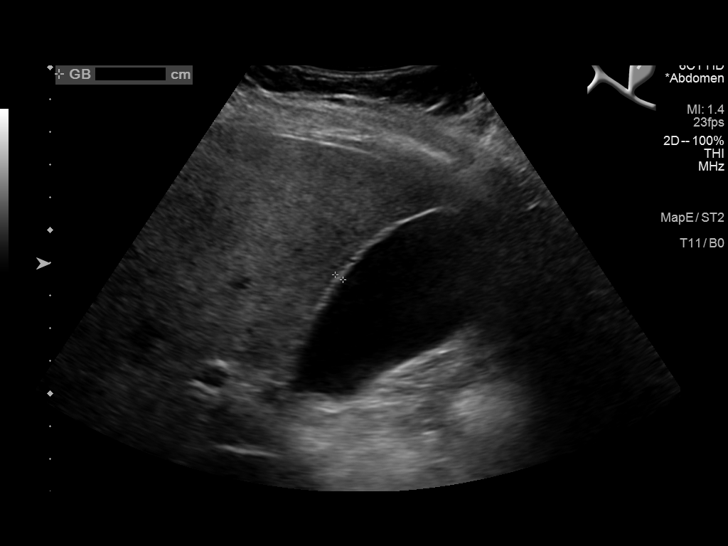
[im 9/52]
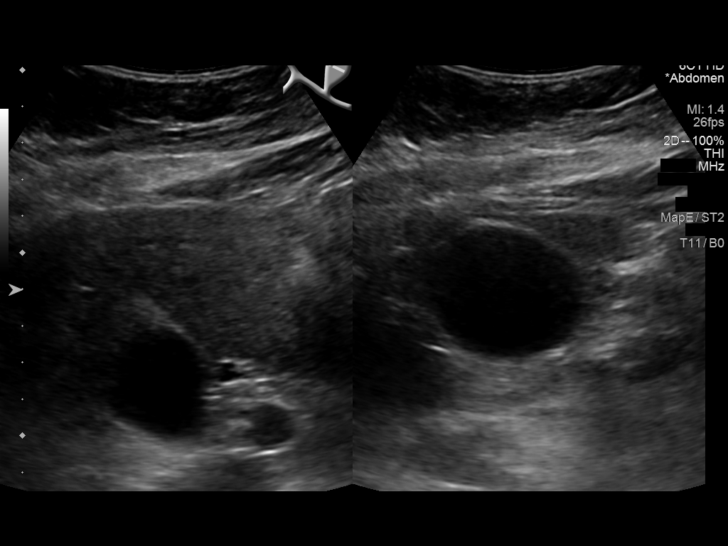
[im 13/52]
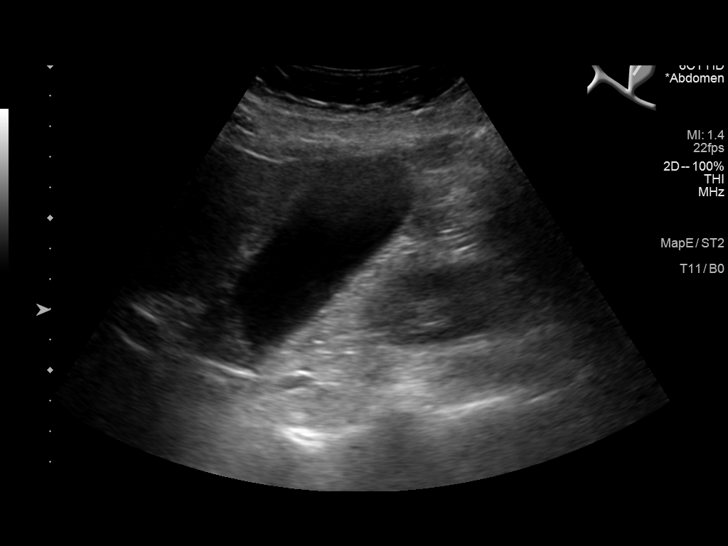
[im 18/52]
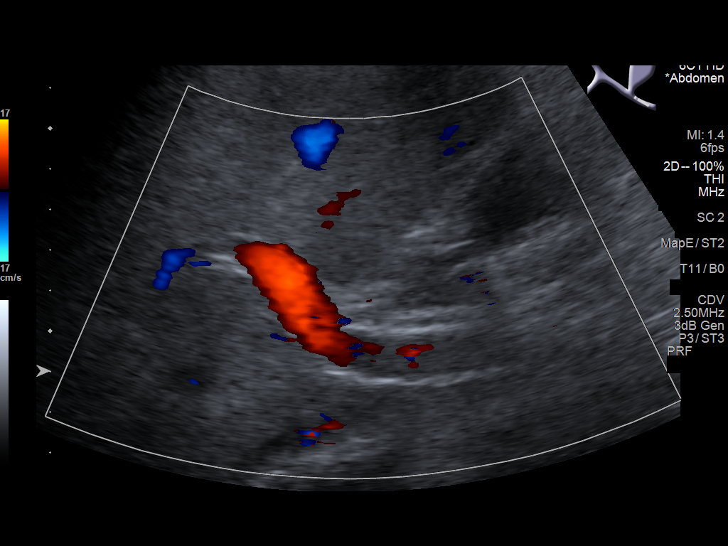
[im 20/52]
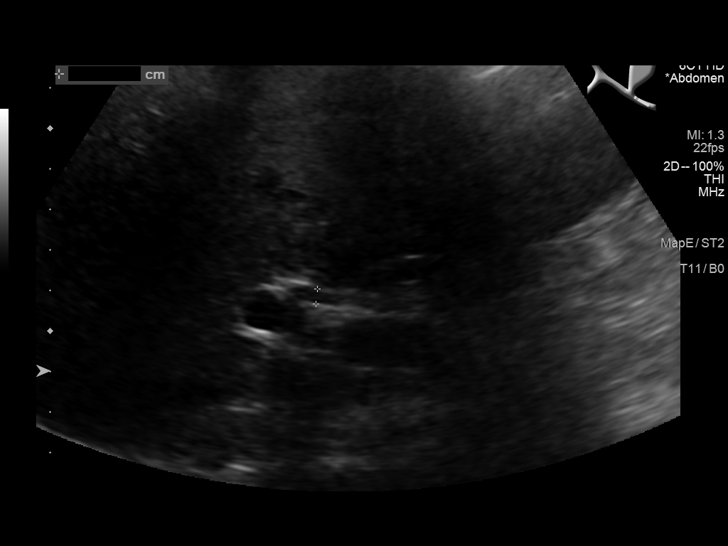
[im 24/52]
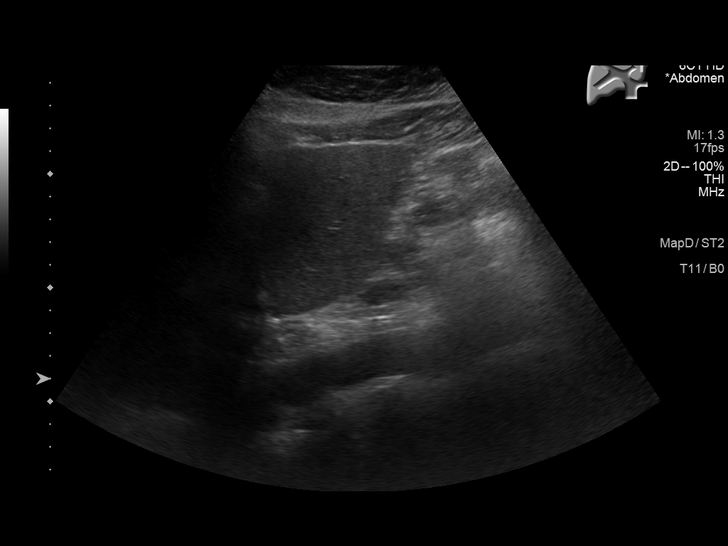
[im 28/52]
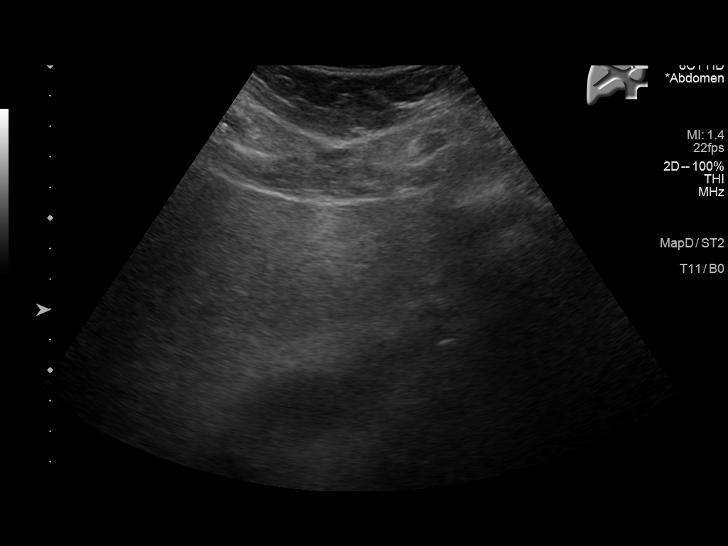
[im 32/52]
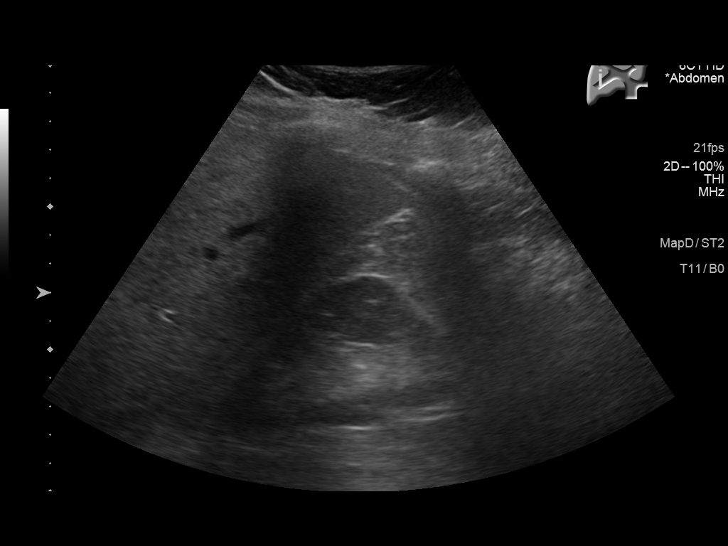
[im 35/52]
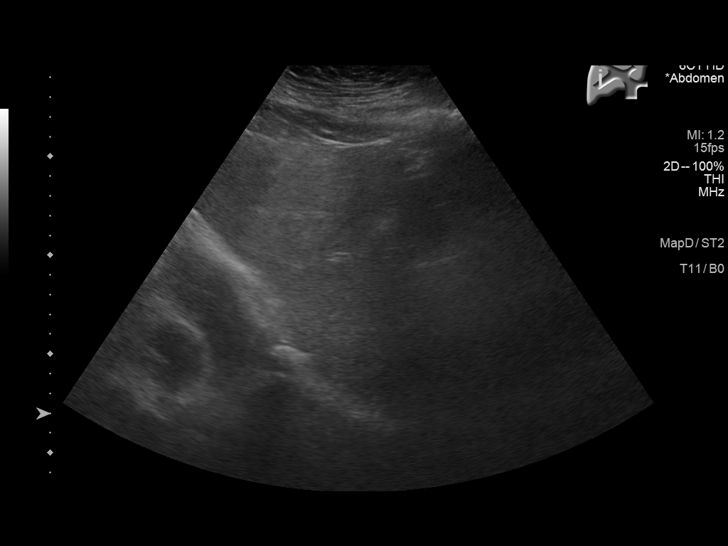
[im 39/52]
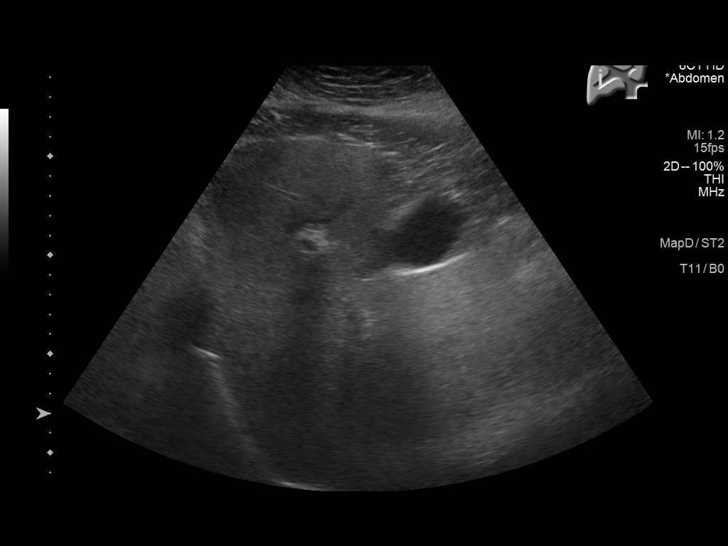
[im 43/52]
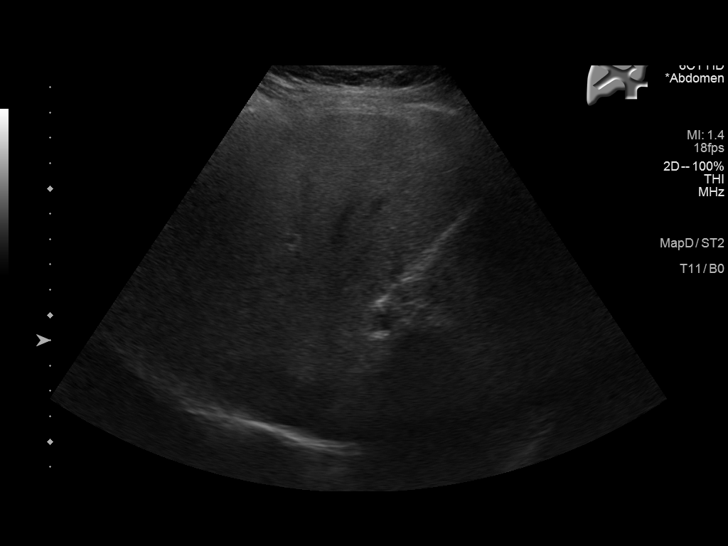
[im 47/52]
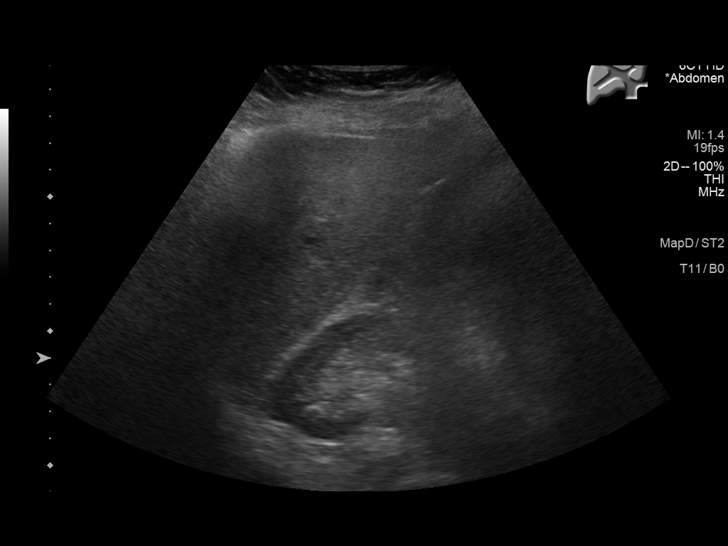
[im 52/52]
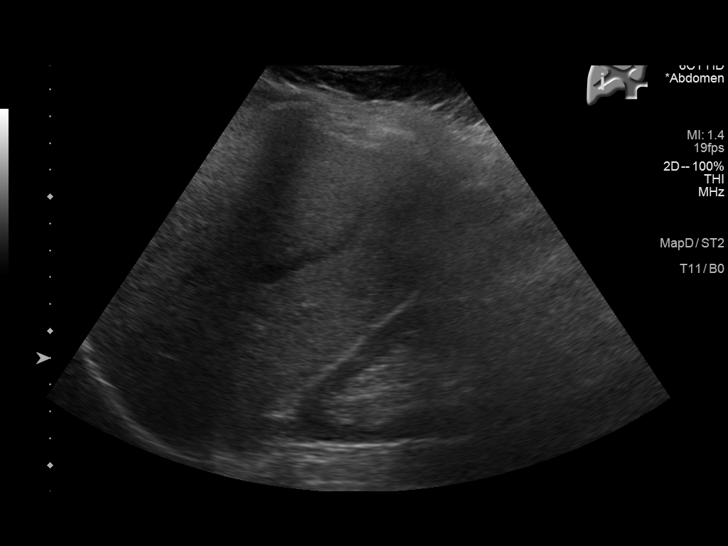

[14 of 25 positions shown; findings below may reference images not displayed]

FINDINGS: Suboptimal evaluation, with poor acoustic penetration secondary to
patient habitus.

Gallbladder:

Mildly distended gallbladder. No gallstones or wall thickening
visualized. No sonographic Murphy sign noted by sonographer.

Common bile duct:

Diameter: 0.4 cm

Liver:

No focal lesion identified. Increased hepatic parenchymal
echogenicity. Portal vein is patent on color Doppler imaging with
normal direction of blood flow towards the liver.

Other: No perihepatic fluid.
IMPRESSION: Suboptimal evaluation, within these constraints;

1. Mildly distended gallbladder, without gallstones or additional
sonographic findings to suggest acute cholecystitis.
2. Echogenic liver. Findings most commonly seen in hepatic
steatosis, though may also represent hepatitis and/or fibrosis.

## 2022-09-19 ENCOUNTER — Ambulatory Visit: Payer: Managed Care, Other (non HMO) | Admitting: Internal Medicine

## 2022-12-03 ENCOUNTER — Telehealth: Payer: Managed Care, Other (non HMO) | Admitting: Physician Assistant

## 2022-12-03 DIAGNOSIS — J111 Influenza due to unidentified influenza virus with other respiratory manifestations: Secondary | ICD-10-CM

## 2022-12-03 MED ORDER — OSELTAMIVIR PHOSPHATE 75 MG PO CAPS: 75.0000 mg | ORAL_CAPSULE | Freq: Two times a day (BID) | ORAL | 0 refills | Status: DC

## 2022-12-03 MED ORDER — PROMETHAZINE-DM 6.25-15 MG/5ML PO SYRP: 5.0000 mL | ORAL_SOLUTION | Freq: Four times a day (QID) | ORAL | 0 refills | Status: DC | PRN

## 2022-12-03 MED ORDER — BENZONATATE 100 MG PO CAPS: 100.0000 mg | ORAL_CAPSULE | Freq: Three times a day (TID) | ORAL | 0 refills | Status: DC | PRN

## 2022-12-03 NOTE — Patient Instructions (Signed)
Toni Vang, thank you for joining Toni Loveless, PA-C for today's virtual visit.  While this provider is not your primary care provider (PCP), if your PCP is located in our provider database this encounter information will be shared with them immediately following your visit.   A [facility] MyChart account gives you access to today's visit and all your visits, tests, and labs performed at [facility] " click here if you don't have a [facility] MyChart account or go to [email or web address]  Consent: (Patient) Toni Vang provided verbal consent for this virtual visit at the beginning of the encounter.  Current Medications:  Current Outpatient Medications:    benzonatate (TESSALON) 100 MG capsule, Take 1 capsule (100 mg total) by mouth 3 (three) times daily as needed., Disp: 30 capsule, Rfl: 0   oseltamivir (TAMIFLU) 75 MG capsule, Take 1 capsule (75 mg total) by mouth 2 (two) times daily., Disp: 10 capsule, Rfl: 0   promethazine-dextromethorphan (PROMETHAZINE-DM) 6.25-15 MG/5ML syrup, Take 5 mLs by mouth 4 (four) times daily as needed., Disp: 118 mL, Rfl: 0   acetaminophen (TYLENOL) 500 MG tablet, Take 1,000 mg by mouth every 6 (six) hours as needed., Disp: , Rfl:    Cholecalciferol 125 MCG (5000 UT) TABS, Take by mouth., Disp: , Rfl:    fluconazole (DIFLUCAN) 200 MG tablet, Take 1 tablet (200 mg total) by mouth daily., Disp: 1 tablet, Rfl: 0   hydrochlorothiazide (HYDRODIURIL) 25 MG tablet, Take 1 tablet (25 mg total) by mouth daily., Disp: 90 tablet, Rfl: 0   ibuprofen (ADVIL) 200 MG tablet, Take 200 mg by mouth every 6 (six) hours as needed., Disp: , Rfl:    omeprazole (PRILOSEC) 20 MG capsule, Take 40 mg by mouth 2 (two) times daily before a meal., Disp: , Rfl:    venlafaxine XR (EFFEXOR XR) 37.5 MG 24 hr capsule, Take 1 capsule (37.5 mg total) by mouth daily with breakfast., Disp: 90 capsule, Rfl: 0   Medications ordered in this encounter:  Meds ordered this  encounter  Medications   oseltamivir (TAMIFLU) 75 MG capsule    Sig: Take 1 capsule (75 mg total) by mouth 2 (two) times daily.    Dispense:  10 capsule    Refill:  0    Order Specific Question:   Supervising Provider    Answer:   Merrilee Jansky [id number]   promethazine-dextromethorphan (PROMETHAZINE-DM) 6.25-15 MG/5ML syrup    Sig: Take 5 mLs by mouth 4 (four) times daily as needed.    Dispense:  118 mL    Refill:  0    Order Specific Question:   Supervising Provider    Answer:   Merrilee Jansky [[id number]]   benzonatate (TESSALON) 100 MG capsule    Sig: Take 1 capsule (100 mg total) by mouth 3 (three) times daily as needed.    Dispense:  30 capsule    Refill:  0    Order Specific Question:   Supervising Provider    Answer:   Merrilee Jansky [id number]     *If you need refills on other medications prior to your next appointment, please contact your pharmacy*  Follow-Up: Call back or seek an in-person evaluation if the symptoms worsen or if the condition fails to improve as anticipated.  [facility] [phone]  Other Instructions  Influenza, Adult Influenza, also called "the flu," is a viral infection that mainly affects the respiratory tract. This includes the lungs, nose, and throat. The  flu spreads easily from person to person (is contagious). It causes common cold symptoms, along with high fever and body aches. What are the causes? This condition is caused by the influenza virus. You can get the virus by: Breathing in droplets that are in the air from an infected person's cough or sneeze. Touching something that has the virus on it (has been contaminated) and then touching your mouth, nose, or eyes. What increases the risk? The following factors may make you more likely to get the flu: Not washing or sanitizing your hands often. Having close contact with many people during cold and flu season. Touching your mouth, eyes, or nose without first washing  or sanitizing your hands. Not getting an annual flu shot. You may have a higher risk for the flu, including serious problems, such as a lung infection (pneumonia), if you: Are older than 65. Are pregnant. Have a weakened disease-fighting system (immune system). This includes people who have HIV or AIDS, are on chemotherapy, or are taking medicines that reduce (suppress) the immune system. Have a long-term (chronic) illness, such as heart disease, kidney disease, diabetes, or lung disease. Have a liver disorder. Are severely overweight (morbidly obese). Have anemia. Have asthma. What are the signs or symptoms? Symptoms of this condition usually begin suddenly and last 4-14 days. These may include: Fever and chills. Headaches, body aches, or muscle aches. Sore throat. Cough. Runny or stuffy (congested) nose. Chest discomfort. Poor appetite. Weakness or fatigue. Dizziness. Nausea or vomiting. How is this diagnosed? This condition may be diagnosed based on: Your symptoms and medical history. A physical exam. Swabbing your nose or throat and testing the fluid for the influenza virus. How is this treated? If the flu is diagnosed early, you can be treated with antiviral medicine that is given by mouth (orally) or through an IV. This can help reduce how severe the illness is and how long it lasts. Taking care of yourself at home can help relieve symptoms. Your health care provider may recommend: Taking over-the-counter medicines. Drinking plenty of fluids. In many cases, the flu goes away on its own. If you have severe symptoms or complications, you may be treated in a hospital. Follow these instructions at home: Activity Rest as needed and get plenty of sleep. Stay home from work or school as told by your health care provider. Unless you are visiting your health care provider, avoid leaving home until your fever has been gone for 24 hours without taking medicine. Eating and  drinking Take an oral rehydration solution (ORS). This is a drink that is sold at pharmacies and retail stores. Drink enough fluid to keep your urine pale yellow. Drink clear fluids in small amounts as you are able. Clear fluids include water, ice chips, fruit juice mixed with water, and low-calorie sports drinks. Eat bland, easy-to-digest foods in small amounts as you are able. These foods include bananas, applesauce, rice, lean meats, toast, and crackers. Avoid drinking fluids that contain a lot of sugar or caffeine, such as energy drinks, regular sports drinks, and soda. Avoid alcohol. Avoid spicy or fatty foods. General instructions     Take over-the-counter and prescription medicines only as told by your health care provider. Use a cool mist humidifier to add humidity to the air in your home. This can make it easier to breathe. When using a cool mist humidifier, clean it daily. Empty the water and replace it with clean water. Cover your mouth and nose when you cough or sneeze.  Wash your hands with soap and water often and for at least 20 seconds, especially after you cough or sneeze. If soap and water are not available, use alcohol-based hand sanitizer. Keep all follow-up visits. This is important. How is this prevented?  Get an annual flu shot. This is usually available in late summer, fall, or winter. Ask your health care provider when you should get your flu shot. Avoid contact with people who are sick during cold and flu season. This is generally fall and winter. Contact a health care provider if: You develop new symptoms. You have: Chest pain. Diarrhea. A fever. Your cough gets worse. You produce more mucus. You feel nauseous or you vomit. Get help right away if you: Develop shortness of breath or have difficulty breathing. Have skin or nails that turn a bluish color. Have severe pain or stiffness in your neck. Develop a sudden headache or sudden pain in your face or  ear. Cannot eat or drink without vomiting. These symptoms may represent a serious problem that is an emergency. Do not wait to see if the symptoms will go away. Get medical help right away. Call your local emergency services (911 in the [place] Do not drive yourself to the hospital. Summary Influenza, also called "the flu," is a viral infection that primarily affects your respiratory tract. Symptoms of the flu usually begin suddenly and last 4-14 days. Getting an annual flu shot is the best way to prevent getting the flu. Stay home from work or school as told by your health care provider. Unless you are visiting your health care provider, avoid leaving home until your fever has been gone for 24 hours without taking medicine. Keep all follow-up visits. This is important. This information is not intended to replace advice given to you by your health care provider. Make sure you discuss any questions you have with your health care provider. Document Revised: 07/15/2020 Document Reviewed: 07/15/2020 Elsevier Patient Education  2023 Elsevier Inc.    If you have been instructed to have an in-person evaluation today at a local Urgent Care facility, please use the link below. It will take you to a list of all of our available [facility], including address, phone number and hours of operation. Please do not delay care.  [facility]  If you or a family member do not have a primary care provider, use the link below to schedule a visit and establish care. When you choose a [facility] primary care physician or advanced practice provider, you gain a long-term partner in health. Find a Primary Care Provider  Learn more about [facility] in-office and virtual care options: [facility] Get Care Now

## 2022-12-03 NOTE — Progress Notes (Signed)
Virtual Visit Consent   Toni Vang, you are scheduled for a virtual visit with a [facility] provider today. Just as with appointments in the office, your consent must be obtained to participate. Your consent will be active for this visit and any virtual visit you may have with one of our providers in the next 365 days. If you have a MyChart account, a copy of this consent can be sent to you electronically.  As this is a virtual visit, video technology does not allow for your provider to perform a traditional examination. This may limit your provider's ability to fully assess your condition. If your provider identifies any concerns that need to be evaluated in person or the need to arrange testing (such as labs, EKG, etc.), we will make arrangements to do so. Although advances in technology are sophisticated, we cannot ensure that it will always work on either your end or our end. If the connection with a video visit is poor, the visit may have to be switched to a telephone visit. With either a video or telephone visit, we are not always able to ensure that we have a secure connection.  By engaging in this virtual visit, you consent to the provision of healthcare and authorize for your insurance to be billed (if applicable) for the services provided during this visit. Depending on your insurance coverage, you may receive a charge related to this service.  I need to obtain your verbal consent now. Are you willing to proceed with your visit today? Toni Vang has provided verbal consent on 12/03/2022 for a virtual visit (video or telephone). Toni Loveless, PA-C  Date: 12/03/2022 7:05 PM  Virtual Visit via Video Note   I, Toni Vang, connected with  Toni Vang  ([id number], 06/15/1966) on 12/03/22 at  7:15 PM EST by a video-enabled telemedicine application and verified that I am speaking with the correct person using two identifiers.  Location: Patient: Virtual Visit Location  Patient: Home Provider: Virtual Visit Location Provider: Home Office   I discussed the limitations of evaluation and management by telemedicine and the availability of in person appointments. The patient expressed understanding and agreed to proceed.    History of Present Illness: Toni Vang is a 56 y.o. who identifies as a female who was assigned female at birth, and is being seen today for flu-like illness.  HPI: Influenza This is a new problem. The current episode started yesterday. The problem occurs constantly. Associated symptoms include chills, congestion, coughing, diaphoresis, fatigue, a fever (101), headaches, myalgias and nausea. Pertinent negatives include no sore throat or vomiting. Associated symptoms comments: diarrhea. She has tried acetaminophen (mucinex, nyquil, dayquil) for the symptoms. The treatment provided no relief.    Had Flu exposure last week at work.   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   Gastroesophageal reflux disease without esophagitis 08/24/2019   Anxiety 09/16/2018   Vitamin D deficiency 08/22/2015    Allergies:  Allergies  Allergen Reactions   Codeine Itching   Codeine Sulfate Nausea Only   Erythromycin Nausea Only   Latex Rash  Medications:  Current Outpatient Medications:    benzonatate (TESSALON) 100 MG capsule, Take 1 capsule (100 mg total) by mouth 3 (three) times daily as needed., Disp: 30 capsule, Rfl: 0   oseltamivir (TAMIFLU) 75 MG capsule, Take 1 capsule (75 mg total) by mouth 2 (two) times daily., Disp: 10 capsule, Rfl: 0    promethazine-dextromethorphan (PROMETHAZINE-DM) 6.25-15 MG/5ML syrup, Take 5 mLs by mouth 4 (four) times daily as needed., Disp: 118 mL, Rfl: 0   acetaminophen (TYLENOL) 500 MG tablet, Take 1,000 mg by mouth every 6 (six) hours as needed., Disp: , Rfl:    Cholecalciferol 125 MCG (5000 UT) TABS, Take by mouth., Disp: , Rfl:    fluconazole (DIFLUCAN) 200 MG tablet, Take 1 tablet (200 mg total) by mouth daily., Disp: 1 tablet, Rfl: 0   hydrochlorothiazide (HYDRODIURIL) 25 MG tablet, Take 1 tablet (25 mg total) by mouth daily., Disp: 90 tablet, Rfl: 0   ibuprofen (ADVIL) 200 MG tablet, Take 200 mg by mouth every 6 (six) hours as needed., Disp: , Rfl:    omeprazole (PRILOSEC) 20 MG capsule, Take 40 mg by mouth 2 (two) times daily before a meal., Disp: , Rfl:    venlafaxine XR (EFFEXOR XR) 37.5 MG 24 hr capsule, Take 1 capsule (37.5 mg total) by mouth daily with breakfast., Disp: 90 capsule, Rfl: 0  Observations/Objective: Patient is well-developed, well-nourished in no acute distress.  Resting comfortably at home.  Head is normocephalic, atraumatic.  No labored breathing.  Speech is clear and coherent with logical content.  Patient is alert and oriented at baseline.    Assessment and Plan: 1. Influenza - oseltamivir (TAMIFLU) 75 MG capsule; Take 1 capsule (75 mg total) by mouth 2 (two) times daily.  Dispense: 10 capsule; Refill: 0 - promethazine-dextromethorphan (PROMETHAZINE-DM) 6.25-15 MG/5ML syrup; Take 5 mLs by mouth 4 (four) times daily as needed.  Dispense: 118 mL; Refill: 0 - benzonatate (TESSALON) 100 MG capsule; Take 1 capsule (100 mg total) by mouth 3 (three) times daily as needed.  Dispense: 30 capsule; Refill: 0  - Suspect influenza due to symptoms and positive exposure - Negative at home Covid 19 testing - Tamiflu prescribed - Tessalon perles and Promethazine DMfor cough - Continue OTC medication of choice for symptomatic management - Push fluids - Rest - Seek in person  evaluation if symptoms worsen or fail to improve   Follow Up Instructions: I discussed the assessment and treatment plan with the patient. The patient was provided an opportunity to ask questions and all were answered. The patient agreed with the plan and demonstrated an understanding of the instructions.  A copy of instructions were sent to the patient via MyChart unless otherwise noted below.    The patient was advised to call back or seek an in-person evaluation if the symptoms worsen or if the condition fails to improve as anticipated.  Time:  I spent 10 minutes with the patient via telehealth technology discussing the above problems/concerns.    Toni Loveless, PA-C

## 2022-12-10 ENCOUNTER — Telehealth: Payer: Managed Care, Other (non HMO)

## 2023-03-05 ENCOUNTER — Other Ambulatory Visit: Payer: Self-pay | Admitting: Internal Medicine

## 2023-03-05 DIAGNOSIS — Z1231 Encounter for screening mammogram for malignant neoplasm of breast: Secondary | ICD-10-CM

## 2023-03-19 ENCOUNTER — Ambulatory Visit
Admission: RE | Admit: 2023-03-19 | Discharge: 2023-03-19 | Disposition: A | Discharge status: Home / Self Care | Payer: Managed Care, Other (non HMO) | Source: Ambulatory Visit | Attending: Internal Medicine | Admitting: Internal Medicine

## 2023-03-19 DIAGNOSIS — Z1231 Encounter for screening mammogram for malignant neoplasm of breast: Secondary | ICD-10-CM | POA: Insufficient documentation

## 2023-03-20 ENCOUNTER — Other Ambulatory Visit: Payer: Self-pay | Admitting: Internal Medicine

## 2023-03-20 DIAGNOSIS — N6489 Other specified disorders of breast: Secondary | ICD-10-CM

## 2023-03-20 DIAGNOSIS — R928 Other abnormal and inconclusive findings on diagnostic imaging of breast: Secondary | ICD-10-CM

## 2023-03-27 ENCOUNTER — Other Ambulatory Visit: Payer: Self-pay | Admitting: Internal Medicine

## 2023-03-27 ENCOUNTER — Ambulatory Visit
Admission: RE | Admit: 2023-03-27 | Discharge: 2023-03-27 | Disposition: A | Discharge status: Home / Self Care | Payer: Managed Care, Other (non HMO) | Source: Ambulatory Visit | Attending: Internal Medicine | Admitting: Internal Medicine

## 2023-03-27 DIAGNOSIS — N63 Unspecified lump in unspecified breast: Secondary | ICD-10-CM

## 2023-03-27 DIAGNOSIS — R928 Other abnormal and inconclusive findings on diagnostic imaging of breast: Secondary | ICD-10-CM

## 2023-03-27 DIAGNOSIS — N6489 Other specified disorders of breast: Secondary | ICD-10-CM

## 2023-04-08 HISTORY — PX: BREAST BIOPSY: SHX20

## 2023-04-09 ENCOUNTER — Ambulatory Visit
Admission: RE | Admit: 2023-04-09 | Discharge: 2023-04-09 | Disposition: A | Discharge status: Home / Self Care | Payer: Managed Care, Other (non HMO) | Source: Ambulatory Visit | Attending: Internal Medicine | Admitting: Internal Medicine

## 2023-04-09 DIAGNOSIS — R928 Other abnormal and inconclusive findings on diagnostic imaging of breast: Secondary | ICD-10-CM

## 2023-04-09 DIAGNOSIS — N63 Unspecified lump in unspecified breast: Secondary | ICD-10-CM

## 2023-04-09 HISTORY — PX: BREAST BIOPSY: SHX20

## 2023-04-09 MED ORDER — LIDOCAINE-EPINEPHRINE 1 %-1:100000 IJ SOLN
8.0000 mL | Freq: Once | INTRAMUSCULAR | Status: AC
  Administered 2023-04-09: 8 mL
  Filled 2023-04-09: qty 8

## 2023-04-09 MED ORDER — LIDOCAINE HCL (PF) 1 % IJ SOLN
2.0000 mL | Freq: Once | INTRAMUSCULAR | Status: AC
  Administered 2023-04-09: 2 mL via INTRADERMAL
  Filled 2023-04-09: qty 2

## 2023-04-10 LAB — SURGICAL PATHOLOGY

## 2023-06-09 ENCOUNTER — Ambulatory Visit
Admission: RE | Admit: 2023-06-09 | Discharge: 2023-06-09 | Disposition: A | Discharge status: Home / Self Care | Payer: Managed Care, Other (non HMO) | Source: Ambulatory Visit | Attending: Emergency Medicine | Admitting: Emergency Medicine

## 2023-06-09 VITALS — BP 154/91 | HR 66 | Temp 97.6°F | Resp 16 | Ht 68.0 in | Wt 187.0 lb

## 2023-06-09 DIAGNOSIS — N390 Urinary tract infection, site not specified: Secondary | ICD-10-CM | POA: Diagnosis not present

## 2023-06-09 LAB — URINALYSIS, W/ REFLEX TO CULTURE (INFECTION SUSPECTED)
Bilirubin Urine: NEGATIVE
Glucose, UA: NEGATIVE mg/dL
Ketones, ur: NEGATIVE mg/dL
Nitrite: NEGATIVE
Protein, ur: NEGATIVE mg/dL
Specific Gravity, Urine: 1.015 (ref 1.005–1.030)
WBC, UA: >50 WBC/hpf (ref 0–5)
pH: 7.5 (ref 5.0–8.0)

## 2023-06-09 MED ORDER — NITROFURANTOIN MONOHYD MACRO 100 MG PO CAPS: 100.0000 mg | ORAL_CAPSULE | Freq: Two times a day (BID) | ORAL | 0 refills | Status: DC

## 2023-06-09 MED ORDER — PHENAZOPYRIDINE HCL 200 MG PO TABS: 200.0000 mg | ORAL_TABLET | Freq: Three times a day (TID) | ORAL | 0 refills | Status: DC

## 2023-06-09 NOTE — Discharge Instructions (Addendum)

## 2023-06-09 NOTE — ED Provider Notes (Signed)
[facility]    CSN: [id number] Arrival date & time: 06/09/23  1401      History   Chief Complaint Chief Complaint  Patient presents with   Dysuria    Appt    HPI Toni Vang is a 57 y.o. female.   HPI  57 year old female with a past medical history significant for hypertension, arthritis, and acid reflux presents for evaluation of 3 days worth of burning with urination along with urinary urgency and frequency.  She has noticed that her urine has been cloudy and she endorses some nausea and mild suprapubic tenderness but no low back pain.  She has not seen any blood in her urine and she denies any vaginal itching or discharge.  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   Gastroesophageal reflux disease without esophagitis 08/24/2019   Anxiety 09/16/2018   Vitamin D deficiency 08/22/2015    Past Surgical History:  Procedure Laterality Date   BLADDER SURGERY  2009   BREAST BIOPSY Right 04/08/2023   u/s bx,massm  2 o'clock, VENUS clip-path pending   BREAST BIOPSY Right 04/09/2023   [place] RT BREAST BX W LOC DEV 1ST LESION IMG BX SPEC US GUIDE 04/09/2023 [facility]   DILATION AND CURETTAGE OF UTERUS  1989   LAPAROSCOPY     NASAL SINUS SURGERY     TOTAL ABDOMINAL HYSTERECTOMY  2009    OB History     Gravida  4   Para  3   Term  3   Preterm      AB  1   Living  3      SAB  1   IAB      Ectopic       Multiple      Live Births  3            Home Medications    Prior to Admission medications   Medication Sig Start Date End Date Taking? Authorizing Provider  nitrofurantoin, macrocrystal-monohydrate, (MACROBID) 100 MG capsule Take 1 capsule (100 mg total) by mouth 2 (two) times daily. 06/09/23  Yes Becky Augusta, NP  phenazopyridine (PYRIDIUM) 200 MG tablet Take 1 tablet (200 mg total) by mouth 3 (three) times daily. 06/09/23  Yes Becky Augusta, NP  escitalopram (LEXAPRO) 10 MG tablet Take 1 tablet (10 mg total) by mouth daily. 03/28/20 06/03/20  Doreene Burke, CNM  sertraline (ZOLOFT) 25 MG tablet Take 1 tablet (25 mg total) by mouth daily. 12/06/20 03/18/21  [name], MD    Family History Family History  Problem Relation Age of Onset   Diabetes Mother    Hypertension Mother    Breast cancer Mother 73   Dementia Mother    Hypertension Father    Cancer Maternal Aunt        ovarian   Diabetes Maternal Grandmother    Diabetes Maternal Grandfather     Social  History Social History   Tobacco Use   Smoking status: Never   Smokeless tobacco: Never  Vaping Use   Vaping Use: Never used  Substance Use Topics   Alcohol use: Not Currently   Drug use: Never     Allergies   Codeine, Codeine sulfate, Erythromycin, and Latex   Review of Systems Review of Systems  Constitutional:  Negative for fever.  Gastrointestinal:  Positive for abdominal pain and nausea. Negative for vomiting.  Genitourinary:  Positive for dysuria, frequency and urgency. Negative for hematuria, vaginal discharge and vaginal pain.  Musculoskeletal:  Negative for back pain.     Physical Exam Triage Vital Signs ED Triage Vitals  Enc Vitals Group     BP --      Pulse --      Resp 06/09/23 1410 16     Temp --      Temp Source 06/09/23 1410 Oral     SpO2 --      Weight 06/09/23 1410 187 lb (84.8 kg)     Height 06/09/23 1410 5\' 8"  (1.727 m)     Head Circumference --      Peak Flow --       Pain Score 06/09/23 1415 0     Pain Loc --      Pain Edu? --      Excl. in GC? --    No data found.  Updated Vital Signs BP (!) 154/91 (BP Location: Left Arm)   Pulse 66   Temp 97.6 F (36.4 C) (Oral)   Resp 16   Ht 5\' 8"  (1.727 m)   Wt 187 lb (84.8 kg)   SpO2 96%   BMI 28.43 kg/m   Visual Acuity Right Eye Distance:   Left Eye Distance:   Bilateral Distance:    Right Eye Near:   Left Eye Near:    Bilateral Near:     Physical Exam Vitals and nursing note reviewed.  Constitutional:      Appearance: Normal appearance. She is not ill-appearing.  HENT:     Head: Normocephalic and atraumatic.  Cardiovascular:     Rate and Rhythm: Normal rate and regular rhythm.     Pulses: Normal pulses.     Heart sounds: Normal heart sounds. No murmur heard.    No friction rub. No gallop.  Pulmonary:     Effort: Pulmonary effort is normal.     Breath sounds: Normal breath sounds. No wheezing, rhonchi or rales.  Abdominal:     General: Abdomen is flat.     Palpations: Abdomen is soft.     Tenderness: There is abdominal tenderness. There is no right CVA tenderness, left CVA tenderness, guarding or rebound.     Comments: Mild suprapubic tenderness without guarding or rebound.  Skin:    General: Skin is warm and dry.     Capillary Refill: Capillary refill takes less than 2 seconds.     Findings: No rash.  Neurological:     General: No focal deficit present.     Mental Status: She is alert and oriented to person, place, and time.      UC Treatments / Results  Labs (all labs ordered are listed, but only abnormal results are displayed) Labs Reviewed  URINALYSIS, W/ REFLEX TO CULTURE (INFECTION SUSPECTED) - Abnormal; Notable for the following components:      Result Value   APPearance HAZY (*)    Hgb urine dipstick MODERATE (*)    Leukocytes,Ua TRACE (*)  Bacteria, UA FEW (*)    All other components within normal limits  URINE CULTURE    EKG   Radiology No results  found.  Procedures Procedures (including critical care time)  Medications Ordered in UC Medications - No data to display  Initial Impression / Assessment and Plan / UC Course  I have reviewed the triage vital signs and the nursing notes.  Pertinent labs & imaging results that were available during my care of the patient were reviewed by me and considered in my medical decision making (see chart for details).   Patient is a nontoxic-appearing 72 old female presenting for evaluation of 3 days with urinary complaints as outlined in HPI above.  Her physical exam does reveal some mild suprapubic tenderness but her abdomen is otherwise soft and she has no guarding or rebound.  No CVA tenderness on exam.  I will order urinalysis to look for the presence of UTI.  Urinalysis shows hazy appearance with moderate hemoglobin and trace leukocyte esterase.  It is negative for nitrites or protein.  Reflex micro shows greater than 50 WBCs, 6-10 RBCs, WBC clumps, and few bacteria.  Urine will reflex to culture.  I will discharge patient home with a diagnosis of UTI and start her on Macrobid 100 mg twice daily for 5 days along with Pyridium 200 mg every 8 hours as needed for urinary discomfort.  She should increase her oral fluid intake so that she increases urine production to help flush her urinary tract.  Return precautions reviewed.   Final Clinical Impressions(s) / UC Diagnoses   Final diagnoses:  Lower urinary tract infectious disease     Discharge Instructions      Take the Macrobid twice daily for 5 days with food for treatment of urinary tract infection.  Use the Pyridium every 8 hours as needed for urinary discomfort.  This will turn your urine a bright red-orange.  Increase your oral fluid intake so that you increase your urine production and or flushing your urinary system.  Take an over-the-counter probiotic, such as Culturelle-Align-Activia, 1 hour after each dose of antibiotic to  prevent diarrhea or yeast infections from forming.  We will culture urine and change the antibiotics if necessary.  Return for reevaluation, or see your primary care provider, for any new or worsening symptoms.      ED Prescriptions     Medication Sig Dispense Auth. Provider   nitrofurantoin, macrocrystal-monohydrate, (MACROBID) 100 MG capsule Take 1 capsule (100 mg total) by mouth 2 (two) times daily. 10 capsule Becky Augusta, NP   phenazopyridine (PYRIDIUM) 200 MG tablet Take 1 tablet (200 mg total) by mouth 3 (three) times daily. 6 tablet Becky Augusta, NP      PDMP not reviewed this encounter.   Becky Augusta, NP 06/09/23 [phone]

## 2023-06-09 NOTE — ED Triage Notes (Signed)
Pt c/o urinary freq,burning & pain x3 days. Denies any hematuria. Has tried AZO w/o relief.

## 2023-06-10 LAB — URINE CULTURE

## 2023-06-11 LAB — URINE CULTURE: Culture: 10000 — AB

## 2023-08-16 ENCOUNTER — Ambulatory Visit: Payer: Managed Care, Other (non HMO) | Admitting: Internal Medicine

## 2023-08-16 ENCOUNTER — Encounter: Payer: Self-pay | Admitting: Internal Medicine

## 2023-08-16 VITALS — BP 126/75 | HR 93 | Ht 68.0 in | Wt 184.4 lb

## 2023-08-16 DIAGNOSIS — Z90710 Acquired absence of both cervix and uterus: Secondary | ICD-10-CM | POA: Diagnosis not present

## 2023-08-16 DIAGNOSIS — N952 Postmenopausal atrophic vaginitis: Secondary | ICD-10-CM | POA: Diagnosis not present

## 2023-08-16 DIAGNOSIS — R310 Gross hematuria: Secondary | ICD-10-CM

## 2023-08-16 DIAGNOSIS — N760 Acute vaginitis: Secondary | ICD-10-CM | POA: Diagnosis not present

## 2023-08-16 LAB — POCT URINALYSIS DIPSTICK
Bilirubin, UA: NEGATIVE
Glucose, UA: NEGATIVE
Ketones, UA: NEGATIVE
Leukocytes, UA: NEGATIVE
Nitrite, UA: NEGATIVE
Protein, UA: NEGATIVE
Spec Grav, UA: 1.02 (ref 1.010–1.025)
Urobilinogen, UA: 0.2 U/dL
pH, UA: 6 (ref 5.0–8.0)

## 2023-08-16 MED ORDER — METRONIDAZOLE 500 MG PO TABS
500.0000 mg | ORAL_TABLET | Freq: Two times a day (BID) | ORAL | 0 refills | Status: DC
Start: 2023-08-16 — End: 2023-08-23

## 2023-08-16 MED ORDER — OSPHENA 60 MG PO TABS
1.0000 | ORAL_TABLET | Freq: Every day | ORAL | 1 refills | Status: DC
Start: 2023-08-16 — End: 2023-12-18

## 2023-08-16 NOTE — Progress Notes (Signed)
Date:  08/16/2023   Name:  Toni Vang   DOB:  [birth year]   MRN:  [id number]   Chief Complaint: Vaginal Pain (Patient had bladder tact at age 57. X 2 weeks now she is feeling a vaginal bulge. Patient did a OTC alkaline test, and the results were elevated. Noticing blood in urine when urinating. )  Vaginal Pain The patient's primary symptoms include a genital odor. The patient's pertinent negatives include no genital rash or vaginal discharge. This is a new problem. The current episode started in the past 7 days. The problem occurs constantly. The problem has been unchanged. The pain is moderate. Pertinent negatives include no chills. She has tried acetaminophen for the symptoms. The treatment provided no relief. She is sexually active. She uses hysterectomy for contraception.    Lab Results  Component Value Date   NA 140 11/28/2021   K 5.3 (H) 11/28/2021   CO2 26 11/28/2021   GLUCOSE 82 11/28/2021   BUN 7 11/28/2021   CREATININE 0.67 11/28/2021   CALCIUM 9.6 11/28/2021   EGFR 103 11/28/2021   GFRNONAA 102 12/06/2020   Lab Results  Component Value Date   CHOL 278 (H) 04/10/2021   HDL 52 04/10/2021   LDLCALC 171 (H) 04/10/2021   TRIG 292 (H) 04/10/2021   CHOLHDL 5.3 (H) 04/10/2021   Lab Results  Component Value Date   TSH 0.925 04/10/2021   No results found for: "HGBA1C" Lab Results  Component Value Date   WBC 7.4 04/10/2021   HGB 12.5 04/10/2021   HCT 38.0 04/10/2021   MCV 86 04/10/2021   PLT 325 04/10/2021   Lab Results  Component Value Date   ALT 16 11/28/2021   AST 18 11/28/2021   ALKPHOS 109 11/28/2021   BILITOT 0.2 11/28/2021   Lab Results  Component Value Date   VD25OH 33.8 05/08/2018     Review of Systems  Constitutional:  Negative for chills and fatigue.  Respiratory:  Negative for chest tightness and shortness of breath.   Cardiovascular:  Negative for chest pain.  Genitourinary:  Positive for vaginal pain. Negative for vaginal discharge.     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   Gastroesophageal reflux disease without esophagitis 08/24/2019   Anxiety 09/16/2018   Vitamin D deficiency 08/22/2015    Allergies  Allergen Reactions   Codeine Itching and Nausea And Vomiting   Codeine Sulfate Nausea Only   Erythromycin Nausea Only   Latex Rash    Past Surgical History:  Procedure Laterality Date   BLADDER SURGERY  2009   BREAST BIOPSY Right 04/08/2023   u/s bx,massm  2 o'clock, VENUS clip-path pending   BREAST BIOPSY Right 04/09/2023   [place] RT BREAST BX W LOC DEV 1ST LESION IMG BX SPEC US GUIDE 04/09/2023 [facility]   DILATION AND CURETTAGE OF UTERUS  1989   LAPAROSCOPY     NASAL SINUS SURGERY     TOTAL ABDOMINAL HYSTERECTOMY  2009    Social History   Tobacco Use   Smoking status: Never   Smokeless tobacco: Never  Vaping Use  Vaping status: Never Used  Substance Use Topics   Alcohol use: Not Currently   Drug use: Never     Medication list has been reviewed and updated.  Current Meds  Medication Sig   metroNIDAZOLE (FLAGYL) 500 MG tablet Take 1 tablet (500 mg total) by mouth 2 (two) times daily for 7 days.   Ospemifene (OSPHENA) 60 MG TABS Take 1 tablet (60 mg total) by mouth daily at 6 (six) AM.       08/16/2023    8:41 AM 07/05/2022    8:19 AM 12/18/2021    3:09 PM 11/28/2021    3:35 PM  GAD 7 : Generalized Anxiety Score  Nervous, Anxious, on Edge 1 1 0 1  Control/stop worrying 1 2 0 1  Worry too much - different things 2 2 0 0  Trouble relaxing 2 1 0 1  Restless 1 1 0 0   Easily annoyed or irritable 0 1 0 0  Afraid - awful might happen 1 2 0 0  Total GAD 7 Score 8 10 0 3  Anxiety Difficulty Not difficult at all Somewhat difficult Not difficult at all Not difficult at all       08/16/2023    8:41 AM 07/05/2022    8:19 AM 12/18/2021    3:08 PM  Depression screen PHQ 2/9  Decreased Interest 0 0 0  Down, Depressed, Hopeless 0 0 0  PHQ - 2 Score 0 0 0  Altered sleeping 2 2 1   Tired, decreased energy 2 2 1   Change in appetite 0 0 1  Feeling bad or failure about yourself  0 0 0  Trouble concentrating 0 0 0  Moving slowly or fidgety/restless 0 0 0  Suicidal thoughts 0 0 0  PHQ-9 Score 4 4 3   Difficult doing work/chores Not difficult at all Not difficult at all Not difficult at all    BP Readings from Last 3 Encounters:  08/16/23 126/75  06/09/23 (!) 154/91  07/05/22 134/78    Physical Exam Constitutional:      Appearance: Normal appearance.  Cardiovascular:     Rate and Rhythm: Normal rate and regular rhythm.  Pulmonary:     Effort: Pulmonary effort is normal.  Abdominal:     General: Abdomen is flat.     Palpations: Abdomen is soft.     Tenderness: There is no abdominal tenderness.  Genitourinary:    Labia:        Right: Tenderness present. No lesion or injury.        Left: Tenderness present. No lesion or injury.      Urethra: No prolapse.     Adnexa: Right adnexa normal and left adnexa normal.       Right: No mass or tenderness.         Left: No mass or tenderness.    Neurological:     Mental Status: She is alert.     Wt Readings from Last 3 Encounters:  08/16/23 184 lb 6.4 oz (83.6 kg)  06/09/23 187 lb (84.8 kg)  07/05/22 199 lb (90.3 kg)    BP 126/75 (BP Location: Left Arm, Cuff Size: Large)   Pulse 93   Ht 5\' 8"  (1.727 m)   Wt 184 lb 6.4 oz (83.6 kg)   SpO2 99%   BMI 28.04 kg/m   Assessment and Plan:  Problem List Items Addressed This Visit       Unprioritized   S/P TAH (total abdominal hysterectomy)  Atrophic  vaginitis    Becoming more problematic with dyspareunia and dryness Will try osphena if affordable and consider risk vs benefit in view of hx of choriocarcinoma      Relevant Medications   Ospemifene (OSPHENA) 60 MG TABS   Other Visit Diagnoses     Acute vaginitis    -  Primary   suspect BV - will treat for 7 days follow up if no improvement   Relevant Medications   metroNIDAZOLE (FLAGYL) 500 MG tablet   Other Relevant Orders   POCT Urinalysis Dipstick   Gross hematuria       will send urine for culture and advise   Relevant Orders   Urine Culture       No follow-ups on file.    [name], MD [facility] [place]

## 2023-08-16 NOTE — Assessment & Plan Note (Signed)
Becoming more problematic with dyspareunia and dryness Will try osphena if affordable and consider risk vs benefit in view of hx of choriocarcinoma

## 2023-08-20 LAB — URINE CULTURE

## 2023-08-23 ENCOUNTER — Encounter: Payer: Self-pay | Admitting: Physician Assistant

## 2023-08-23 ENCOUNTER — Encounter: Payer: Self-pay | Admitting: Internal Medicine

## 2023-08-23 ENCOUNTER — Ambulatory Visit: Payer: Managed Care, Other (non HMO) | Admitting: Physician Assistant

## 2023-08-23 VITALS — BP 110/72 | HR 79 | Temp 97.1°F | Ht 68.0 in | Wt 184.0 lb

## 2023-08-23 DIAGNOSIS — N3001 Acute cystitis with hematuria: Secondary | ICD-10-CM

## 2023-08-23 DIAGNOSIS — N390 Urinary tract infection, site not specified: Secondary | ICD-10-CM | POA: Insufficient documentation

## 2023-08-23 LAB — POCT URINALYSIS DIPSTICK
Bilirubin, UA: NEGATIVE
Glucose, UA: NEGATIVE
Ketones, UA: NEGATIVE
Nitrite, UA: NEGATIVE
Protein, UA: NEGATIVE
Spec Grav, UA: 1.01 (ref 1.010–1.025)
Urobilinogen, UA: 0.2 U/dL
pH, UA: 7.5 (ref 5.0–8.0)

## 2023-08-23 MED ORDER — PHENAZOPYRIDINE HCL 200 MG PO TABS
200.0000 mg | ORAL_TABLET | Freq: Three times a day (TID) | ORAL | 0 refills | Status: AC | PRN
Start: 2023-08-23 — End: 2023-08-25

## 2023-08-23 MED ORDER — SULFAMETHOXAZOLE-TRIMETHOPRIM 800-160 MG PO TABS
1.0000 | ORAL_TABLET | Freq: Two times a day (BID) | ORAL | 0 refills | Status: AC
Start: 2023-08-23 — End: 2023-08-28

## 2023-08-23 NOTE — Progress Notes (Signed)
Date:  08/23/2023   Name:  Toni Vang   DOB:  1966/09/14   MRN:  [id number]   Chief Complaint: Urinary Tract Infection  Urinary Tract Infection  This is a new problem. Episode onset: X1 day. The problem has been unchanged. The quality of the pain is described as aching, burning, shooting and stabbing. The pain is at a severity of 8/10. The pain is moderate. There has been no fever. She is Sexually active. There is No history of pyelonephritis. Associated symptoms include flank pain, frequency, hematuria and urgency. Pertinent negatives include no chills. Treatments tried: AZO otc. The treatment provided mild relief. Her past medical history is significant for recurrent UTIs.   Toni Vang is a pleasant 57 year old female new to me today, typically sees my colleague Dr. Bari Edward, MD, here for evaluation of suspected UTI for the past week, but particularly worse over the last day.  Symptoms as above.  Endorses gross hematuria and is certain that it is from the urinary tract and not vaginal bleeding.  Recently saw Dr. Judithann Graves 08/16/2023 urine dipstick at the time showed large blood but otherwise normal and culture was inconclusive with modest growth of mixed flora.  She recently completed metronidazole for BV.   Medication list has been reviewed and updated.  Current Meds  Medication Sig   phenazopyridine (PYRIDIUM) 200 MG tablet Take 1 tablet (200 mg total) by mouth 3 (three) times daily as needed for up to 2 days (Urinary pain/discomfort). May discolor urine. Do not take >2 days. Remember this medication does not treat infection and is only for relief of symptoms.   sulfamethoxazole-trimethoprim (BACTRIM DS) 800-160 MG tablet Take 1 tablet by mouth 2 (two) times daily for 5 days.     Review of Systems  Constitutional:  Positive for fatigue. Negative for chills and fever.       Malaise  Respiratory:  Negative for chest tightness and shortness of breath.   Cardiovascular:  Negative for  chest pain and palpitations.  Gastrointestinal:  Negative for abdominal pain.  Genitourinary:  Positive for dysuria, flank pain, frequency, hematuria and urgency. Negative for vaginal pain.    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   Gastroesophageal reflux disease without esophagitis 08/24/2019   Anxiety 09/16/2018   Vitamin D deficiency 08/22/2015    Allergies  Allergen Reactions   Codeine Itching and Nausea And Vomiting   Codeine Sulfate Nausea Only   Erythromycin Nausea Only   Latex Rash    Immunization History  Administered Date(s) Administered   PFIZER SARS-COV-2 Pediatric Vaccination 5-46yrs 04/15/2020   PFIZER(Purple Top)SARS-COV-2 Vaccination 03/25/2020    Past Surgical History:  Procedure Laterality Date   BLADDER SURGERY  2009   BREAST BIOPSY Right 04/08/2023   u/s bx,massm  2 o'clock, VENUS clip-path pending   BREAST BIOPSY Right 04/09/2023   [place] RT BREAST BX W LOC DEV 1ST LESION IMG BX SPEC US GUIDE 04/09/2023 [facility]   DILATION AND CURETTAGE OF UTERUS  1989   LAPAROSCOPY     NASAL SINUS SURGERY     TOTAL ABDOMINAL HYSTERECTOMY  2009  Social History   Tobacco Use   Smoking status: Never   Smokeless tobacco: Never  Vaping Use   Vaping status: Never Used  Substance Use Topics   Alcohol use: Not Currently   Drug use: Never    Family History  Problem Relation Age of Onset   Diabetes Mother    Hypertension Mother    Breast cancer Mother 44   Dementia Mother    Hypertension Father    Cancer Maternal Aunt        ovarian    Diabetes Maternal Grandmother    Diabetes Maternal Grandfather         08/23/2023    8:50 AM 08/16/2023    8:41 AM 07/05/2022    8:19 AM 12/18/2021    3:09 PM  GAD 7 : Generalized Anxiety Score  Nervous, Anxious, on Edge 1 1 1  0  Control/stop worrying 1 1 2  0  Worry too much - different things 2 2 2  0  Trouble relaxing 2 2 1  0  Restless 1 1 1  0  Easily annoyed or irritable 0 0 1 0  Afraid - awful might happen 1 1 2  0  Total GAD 7 Score 8 8 10  0  Anxiety Difficulty Not difficult at all Not difficult at all Somewhat difficult Not difficult at all       08/23/2023    8:50 AM 08/16/2023    8:41 AM 07/05/2022    8:19 AM  Depression screen PHQ 2/9  Decreased Interest 1 0 0  Down, Depressed, Hopeless 0 0 0  PHQ - 2 Score 1 0 0  Altered sleeping 1 2 2   Tired, decreased energy 3 2 2   Change in appetite 1 0 0  Feeling bad or failure about yourself  0 0 0  Trouble concentrating 1 0 0  Moving slowly or fidgety/restless 0 0 0  Suicidal thoughts 0 0 0  PHQ-9 Score 7 4 4   Difficult doing work/chores Not difficult at all Not difficult at all Not difficult at all    BP Readings from Last 3 Encounters:  08/23/23 110/72  08/16/23 126/75  06/09/23 (!) 154/91    Wt Readings from Last 3 Encounters:  08/23/23 184 lb (83.5 kg)  08/16/23 184 lb 6.4 oz (83.6 kg)  06/09/23 187 lb (84.8 kg)    BP 110/72   Pulse 79   Temp (!) 97.1 F (36.2 C) (Oral)   Ht 5\' 8"  (1.727 m)   Wt 184 lb (83.5 kg)   SpO2 99%   BMI 27.98 kg/m   Physical Exam Vitals and nursing note reviewed.  Constitutional:      Appearance: Normal appearance.  Cardiovascular:     Rate and Rhythm: Normal rate and regular rhythm.     Heart sounds: No murmur heard.    No friction rub. No gallop.  Pulmonary:     Effort: Pulmonary effort is normal.     Breath sounds: Normal breath sounds.  Abdominal:     General: There is no distension.     Palpations: Abdomen is soft.     Tenderness: There is no abdominal tenderness.  There is right CVA tenderness. There is no left CVA tenderness.  Musculoskeletal:        General: Normal range of motion.  Skin:    General: Skin is warm and dry.  Neurological:     Mental Status: She is alert and oriented to person, place, and time.     Gait: Gait is intact.  Psychiatric:        Mood and Affect: Mood and affect normal.       Recent Labs     Component Value Date/Time   NA 140 11/28/2021 1609   K 5.3 (H) 11/28/2021 1609   CL 103 11/28/2021 1609   CO2 26 11/28/2021 1609   GLUCOSE 82 11/28/2021 1609   BUN 7 11/28/2021 1609   CREATININE 0.67 11/28/2021 1609   CALCIUM 9.6 11/28/2021 1609   PROT 7.4 11/28/2021 1609   ALBUMIN 4.6 11/28/2021 1609   AST 18 11/28/2021 1609   ALT 16 11/28/2021 1609   ALKPHOS 109 11/28/2021 1609   BILITOT 0.2 11/28/2021 1609   GFRNONAA 102 12/06/2020 1618   GFRAA 118 12/06/2020 1618    Lab Results  Component Value Date   WBC 7.4 04/10/2021   HGB 12.5 04/10/2021   HCT 38.0 04/10/2021   MCV 86 04/10/2021   PLT 325 04/10/2021   No results found for: "HGBA1C" Lab Results  Component Value Date   CHOL 278 (H) 04/10/2021   HDL 52 04/10/2021   LDLCALC 171 (H) 04/10/2021   TRIG 292 (H) 04/10/2021   CHOLHDL 5.3 (H) 04/10/2021   Lab Results  Component Value Date   TSH 0.925 04/10/2021     Assessment and Plan:  1. Acute cystitis with hematuria Dipstick showing large blood and large leuks today, begin treatment with Bactrim considering her right CVA tenderness.  Urine culture pending.  Azo for comfort  - POCT urinalysis dipstick - Urine Culture - sulfamethoxazole-trimethoprim (BACTRIM DS) 800-160 MG tablet; Take 1 tablet by mouth 2 (two) times daily for 5 days.  Dispense: 10 tablet; Refill: 0 - phenazopyridine (PYRIDIUM) 200 MG tablet; Take 1 tablet (200 mg total) by mouth 3 (three) times daily as needed for up to 2 days (Urinary pain/discomfort). May discolor urine. Do not take >2 days. Remember this medication does not  treat infection and is only for relief of symptoms.  Dispense: 6 tablet; Refill: 0  2. Recurrent UTI This is the patient's third UTI in the last year.  She is concerned with the frequency of UTIs and gross hematuria considering her family history of bladder cancer in the father and her personal history of choriocarcinoma.  She would like referral to urology and I think this is perfectly reasonable.  Would prefer female provider Michiel Cowboy PA-C - Ambulatory referral to Urology   Return if symptoms worsen or fail to improve.    Alvester Morin, PA-C, DMSc, Nutritionist [facility] [phone]

## 2023-08-23 NOTE — Patient Instructions (Signed)
-  It was a pleasure to see you today! Please review your visit summary for helpful information -I would encourage you to follow your care via MyChart where you can access lab results, notes, messages, and more -If you feel that we did a nice job today, please complete your after-visit survey and leave [place] a Google review! Your CMA today was [place] and your provider was Alvester Morin, PA-C, DMSc

## 2023-08-26 ENCOUNTER — Telehealth: Payer: Self-pay | Admitting: Internal Medicine

## 2023-08-26 NOTE — Telephone Encounter (Signed)
Copied from CRM [id number]. Topic: General - Other >> Aug 26, 2023 11:06 AM Macon Large wrote: Reason for CRM: Natalia Leatherwood with [facility] reports that the urine culture specimen that the received did not have a name on the order. Cb# [phone]

## 2023-08-26 NOTE — Telephone Encounter (Signed)
Spoke to [place]. The urine culture will be cancelled.  KP

## 2023-10-22 ENCOUNTER — Encounter: Payer: Self-pay | Admitting: Obstetrics and Gynecology

## 2023-10-23 ENCOUNTER — Other Ambulatory Visit: Payer: Self-pay | Admitting: Obstetrics and Gynecology

## 2023-10-23 DIAGNOSIS — N63 Unspecified lump in unspecified breast: Secondary | ICD-10-CM

## 2023-12-10 ENCOUNTER — Ambulatory Visit
Admission: RE | Admit: 2023-12-10 | Discharge: 2023-12-10 | Disposition: A | Discharge status: Home / Self Care | Payer: Managed Care, Other (non HMO) | Source: Ambulatory Visit | Attending: Obstetrics and Gynecology | Admitting: Obstetrics and Gynecology

## 2023-12-10 DIAGNOSIS — N63 Unspecified lump in unspecified breast: Secondary | ICD-10-CM

## 2023-12-17 ENCOUNTER — Ambulatory Visit: Payer: Self-pay

## 2023-12-17 NOTE — Telephone Encounter (Signed)
 Chief Complaint: Back pain  Symptoms: lower back pain, nausea Frequency: intermittent  Pertinent Negatives: Patient denies radiating pain, fever, swelling Disposition: [] ED /[] Urgent Care (no appt availability in office) / [x] Appointment(In office/virtual)/ []  [facility]/ [] Home Care/ [] Refused Recommended Disposition /[] [facility]/ []  Follow-up with PCP Additional Notes: Patient states her lower back began to hurt yesterday after lifting a heavy laundry basket over a baby safety gate. Patient states the lower back hurts with movement. Patient reports using a heating pad last night and taking tylenol without much relief. Care advice was given and patient has been scheduled with PCP tomorrow at 1120 for evaluation of the back pain.   Reason for Disposition  [1] SEVERE back pain (e.g., excruciating, unable to do any normal activities) AND [2] not improved 2 hours after pain medicine  Answer Assessment - Initial Assessment Questions 1. ONSET: When did the pain begin?      Yesterday  2. LOCATION: Where does it hurt? (upper, mid or lower back)     Lower back pain 3. SEVERITY: How bad is the pain?  (e.g., Scale 1-10; mild, moderate, or severe)   - MILD (1-3): Doesn't interfere with normal activities.    - MODERATE (4-7): Interferes with normal activities or awakens from sleep.    - SEVERE (8-10): Excruciating pain, unable to do any normal activities.      10/10 with movement  4. PATTERN: Is the pain constant? (e.g., yes, no; constant, intermittent)      Intermittent  5. RADIATION: Does the pain shoot into your legs or somewhere else?     No  6. CAUSE:  What do you think is causing the back pain?      I'm not sure 7. BACK OVERUSE:  Any recent lifting of heavy objects, strenuous work or exercise?     I lifted a heavy laundry basket yesterday  8. MEDICINES: What have you taken so far for the pain? (e.g., nothing, acetaminophen, NSAIDS)     Acetaminophen   9. NEUROLOGIC SYMPTOMS: Do you have any weakness, numbness, or problems with bowel/bladder control?     No  10. OTHER SYMPTOMS: Do you have any other symptoms? (e.g., fever, abdomen pain, burning with urination, blood in urine)       Nausea  Protocols used: Back Pain-A-AH

## 2023-12-18 ENCOUNTER — Ambulatory Visit: Payer: Managed Care, Other (non HMO) | Admitting: Internal Medicine

## 2023-12-18 ENCOUNTER — Encounter: Payer: Self-pay | Admitting: Internal Medicine

## 2023-12-18 VITALS — BP 120/68 | HR 75 | Ht 68.0 in | Wt 188.0 lb

## 2023-12-18 DIAGNOSIS — S39012A Strain of muscle, fascia and tendon of lower back, initial encounter: Secondary | ICD-10-CM | POA: Diagnosis not present

## 2023-12-18 DIAGNOSIS — R300 Dysuria: Secondary | ICD-10-CM | POA: Diagnosis not present

## 2023-12-18 DIAGNOSIS — R3121 Asymptomatic microscopic hematuria: Secondary | ICD-10-CM | POA: Diagnosis not present

## 2023-12-18 LAB — POCT URINALYSIS DIPSTICK
Bilirubin, UA: NEGATIVE
Glucose, UA: NEGATIVE
Ketones, UA: NEGATIVE
Leukocytes, UA: NEGATIVE
Nitrite, UA: NEGATIVE
Protein, UA: NEGATIVE
Spec Grav, UA: 1.025 (ref 1.010–1.025)
Urobilinogen, UA: 0.2 U/dL
pH, UA: 5 (ref 5.0–8.0)

## 2023-12-18 MED ORDER — CYCLOBENZAPRINE HCL 10 MG PO TABS
10.0000 mg | ORAL_TABLET | Freq: Every day | ORAL | 0 refills | Status: DC
Start: 2023-12-18 — End: 2024-06-18

## 2023-12-18 NOTE — Assessment & Plan Note (Signed)
 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 - even in absence of infection Family hx of bladder cancer so will refer non urgently to Urology

## 2023-12-18 NOTE — Progress Notes (Signed)
 Date:  12/18/2023   Name:  Toni Vang   DOB:  10/26/1966   MRN:  [id number]   Chief Complaint: Back Pain (Started yesterday. ) and Urinary Frequency (Started 1.5 weeks ago. Dysuria on and off. )  Urinary Frequency  This is a new problem. Episode onset: 10 days ago. The quality of the pain is described as burning. The pain is mild. Associated symptoms include flank pain and urgency. Pertinent negatives include no chills or discharge. She has tried nothing for the symptoms.  Back Pain This is a new problem. The current episode started yesterday. The problem occurs constantly. The problem is unchanged. The pain is present in the sacro-iliac. The quality of the pain is described as shooting. The pain does not radiate. The pain is mild. The symptoms are aggravated by twisting and bending. Pertinent negatives include no abdominal pain, chest pain, fever or headaches. She has tried NSAIDs for the symptoms. The treatment provided mild relief.    Review of Systems  Constitutional:  Negative for chills, fatigue and fever.  Respiratory:  Negative for shortness of breath.   Cardiovascular:  Negative for chest pain.  Gastrointestinal:  Negative for abdominal pain.  Genitourinary:  Positive for flank pain and urgency.  Musculoskeletal:  Positive for back pain.  Neurological:  Negative for dizziness and headaches.  Psychiatric/Behavioral:  The patient is nervous/anxious (good friend and church member passed away suddenly last night).      Lab Results  Component Value Date   NA 140 11/28/2021   K 5.3 (H) 11/28/2021   CO2 26 11/28/2021   GLUCOSE 82 11/28/2021   BUN 7 11/28/2021   CREATININE 0.67 11/28/2021   CALCIUM 9.6 11/28/2021   EGFR 103 11/28/2021   GFRNONAA 102 12/06/2020   Lab Results  Component Value Date   CHOL 278 (H) 04/10/2021   HDL 52 04/10/2021   LDLCALC 171 (H) 04/10/2021   TRIG 292 (H) 04/10/2021   CHOLHDL 5.3 (H) 04/10/2021   Lab Results  Component Value Date   TSH  0.925 04/10/2021   No results found for: HGBA1C Lab Results  Component Value Date   WBC 7.4 04/10/2021   HGB 12.5 04/10/2021   HCT 38.0 04/10/2021   MCV 86 04/10/2021   PLT 325 04/10/2021   Lab Results  Component Value Date   ALT 16 11/28/2021   AST 18 11/28/2021   ALKPHOS 109 11/28/2021   BILITOT 0.2 11/28/2021   Lab Results  Component Value Date   VD25OH 33.8 05/08/2018     Patient Active Problem List   Diagnosis Date Noted   Asymptomatic microscopic hematuria 12/18/2023   Recurrent UTI 08/23/2023   Atrophic vaginitis 08/16/2023   Essential hypertension 07/05/2022   Menopause syndrome 07/05/2022   It band syndrome, right 07/20/2021   Primary osteoarthritis of right knee 04/13/2021   Hyperlipidemia, mixed 04/11/2021   S/P TAH (total abdominal hysterectomy) 12/06/2020   Environmental and seasonal allergies 12/06/2020   Chronic pain of both knees 12/06/2020   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   BREAST BIOPSY Right 04/09/2023   US  RT BREAST BX W LOC DEV 1ST LESION IMG BX SPEC US  GUIDE 04/09/2023 [facility]   DILATION AND CURETTAGE OF UTERUS  1989   LAPAROSCOPY     NASAL SINUS SURGERY     TOTAL ABDOMINAL HYSTERECTOMY  2009    Social History   Tobacco Use   Smoking status: Never   Smokeless tobacco: Never  Vaping Use   Vaping status: Never Used  Substance Use Topics   Alcohol use: Not Currently   Drug use: Never     Medication list has been reviewed and updated.  Current Meds   Medication Sig   cyclobenzaprine  (FLEXERIL ) 10 MG tablet Take 1 tablet (10 mg total) by mouth at bedtime.   estradiol  (ESTRACE ) 1 MG tablet Take 1 mg by mouth daily.   [DISCONTINUED] Ospemifene  (OSPHENA ) 60 MG TABS Take 1 tablet (60 mg total) by mouth daily at 6 (six) AM.       12/18/2023   11:24 AM 08/23/2023    8:50 AM 08/16/2023    8:41 AM 07/05/2022    8:19 AM  GAD 7 : Generalized Anxiety Score  Nervous, Anxious, on Edge 2 1 1 1   Control/stop worrying 2 1 1 2   Worry too much - different things 2 2 2 2   Trouble relaxing 2 2 2 1   Restless 1 1 1 1   Easily annoyed or irritable 1 0 0 1  Afraid - awful might happen 2 1 1 2   Total GAD 7 Score 12 8 8 10   Anxiety Difficulty Somewhat difficult Not difficult at all Not difficult at all Somewhat difficult       12/18/2023   11:24 AM 08/23/2023    8:50 AM 08/16/2023    8:41 AM  Depression screen PHQ 2/9  Decreased Interest 0 1 0  Down, Depressed, Hopeless 0 0 0  PHQ - 2 Score 0 1 0  Altered sleeping  1 2  Tired, decreased energy  3 2  Change in appetite  1 0  Feeling bad or failure about yourself   0 0  Trouble concentrating  1 0  Moving slowly or fidgety/restless  0 0  Suicidal thoughts  0 0  PHQ-9 Score  7 4  Difficult doing work/chores  Not difficult at all Not difficult at all    BP Readings from Last 3 Encounters:  12/18/23 120/68  08/23/23 110/72  08/16/23 126/75    Physical Exam Vitals and nursing note reviewed.  Constitutional:      General: She is not in acute distress.    Appearance: She is well-developed.  HENT:     Head: Normocephalic and atraumatic.  Cardiovascular:     Rate and Rhythm: Normal rate and regular rhythm.  Pulmonary:     Effort: Pulmonary effort is normal. No respiratory distress.     Breath sounds: No wheezing or rhonchi.  Abdominal:     Tenderness: There is no abdominal tenderness.  Musculoskeletal:     Cervical back: Normal range of motion.     Lumbar back: Spasms (right SI region) present.  Negative right straight leg raise test and negative left straight leg raise test.  Skin:    General: Skin is warm and dry.     Findings: No rash.  Neurological:     Mental Status: She is alert and oriented to person, place, and time.     Gait: Gait is intact.  Deep Tendon Reflexes:     Reflex Scores:      Patellar reflexes are 2+ on the right side and 2+ on the left side. Psychiatric:        Mood and Affect: Mood normal.        Behavior: Behavior normal.    Lab Results  Component Value Date   COLORU yellow 12/18/2023   CLARITYU clear 12/18/2023   GLUCOSEUR Negative 12/18/2023   BILIRUBINUR neg 12/18/2023   KETONESU neg 12/18/2023   SPECGRAV 1.025 12/18/2023   RBCUR large 3+ (A) 12/18/2023   PHUR 5.0 12/18/2023   PROTEINUR Negative 12/18/2023   UROBILINOGEN 0.2 12/18/2023   LEUKOCYTESUR Negative 12/18/2023       Wt Readings from Last 3 Encounters:  12/18/23 188 lb (85.3 kg)  08/23/23 184 lb (83.5 kg)  08/16/23 184 lb 6.4 oz (83.6 kg)    BP 120/68   Pulse 75   Ht 5' 8 (1.727 m)   Wt 188 lb (85.3 kg)   SpO2 100%   BMI 28.59 kg/m   Assessment and Plan: Problem List Items Addressed This Visit       Unprioritized   Asymptomatic microscopic hematuria   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 - even in absence of infection Family hx of bladder cancer so will refer non urgently to Urology      Relevant Orders   Ambulatory referral to Urology   Other Visit Diagnoses       Dysuria    -  Primary   UA + - will await culture increase fluids   Relevant Orders   POCT Urinalysis Dipstick   Urine Culture     Sacroiliac strain, initial encounter       Contnue Advil  tid; at flexeril  at HS Use heat, modify activities   Relevant Medications   cyclobenzaprine  (FLEXERIL ) 10 MG tablet           Leita HILARIO Adie, MD [facility] [place]

## 2023-12-20 LAB — URINE CULTURE

## 2024-02-04 ENCOUNTER — Ambulatory Visit: Payer: Managed Care, Other (non HMO) | Admitting: Urology

## 2024-02-18 ENCOUNTER — Ambulatory Visit: Payer: Self-pay | Admitting: Urology

## 2024-03-20 ENCOUNTER — Ambulatory Visit: Admitting: Urology

## 2024-06-18 ENCOUNTER — Ambulatory Visit: Payer: Self-pay

## 2024-06-18 ENCOUNTER — Ambulatory Visit: Admitting: Internal Medicine

## 2024-06-18 ENCOUNTER — Encounter: Payer: Self-pay | Admitting: Internal Medicine

## 2024-06-18 VITALS — BP 144/82 | HR 78 | Ht 68.0 in | Wt 188.0 lb

## 2024-06-18 DIAGNOSIS — S161XXA Strain of muscle, fascia and tendon at neck level, initial encounter: Secondary | ICD-10-CM

## 2024-06-18 DIAGNOSIS — R202 Paresthesia of skin: Secondary | ICD-10-CM | POA: Diagnosis not present

## 2024-06-18 DIAGNOSIS — R200 Anesthesia of skin: Secondary | ICD-10-CM | POA: Diagnosis not present

## 2024-06-18 MED ORDER — CYCLOBENZAPRINE HCL 10 MG PO TABS
10.0000 mg | ORAL_TABLET | Freq: Every day | ORAL | 0 refills | Status: DC
Start: 2024-06-18 — End: 2024-09-16
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
## 2024-06-18 NOTE — Patient Instructions (Signed)
 Take cyclobenzaprine  10 mg at bedtime and 1/2 tablet twice a day if not driving/working.  Take Tylenol 500 mg - 2 tabs three times per day.  Use heat or ice on neck and shoulder.

## 2024-09-09 ENCOUNTER — Encounter: Payer: Self-pay | Admitting: Internal Medicine

## 2024-09-09 ENCOUNTER — Ambulatory Visit: Admitting: Internal Medicine

## 2024-09-09 VITALS — BP 124/74 | HR 87 | Ht 68.0 in | Wt 189.0 lb

## 2024-09-09 DIAGNOSIS — R3121 Asymptomatic microscopic hematuria: Secondary | ICD-10-CM

## 2024-09-09 DIAGNOSIS — R300 Dysuria: Secondary | ICD-10-CM

## 2024-09-09 LAB — POCT URINALYSIS DIPSTICK
Bilirubin, UA: NEGATIVE
Glucose, UA: NEGATIVE
Ketones, UA: NEGATIVE
Leukocytes, UA: NEGATIVE
Nitrite, UA: NEGATIVE
Protein, UA: NEGATIVE
Spec Grav, UA: 1.025 (ref 1.010–1.025)
Urobilinogen, UA: 0.2 U/dL
pH, UA: 5 (ref 5.0–8.0)

## 2024-09-09 MED ORDER — NITROFURANTOIN MONOHYD MACRO 100 MG PO CAPS
100.0000 mg | ORAL_CAPSULE | Freq: Two times a day (BID) | ORAL | 0 refills | Status: DC
Start: 2024-09-09 — End: 2024-09-16
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
## 2024-09-11 ENCOUNTER — Ambulatory Visit: Payer: Self-pay | Admitting: Internal Medicine

## 2024-09-11 ENCOUNTER — Ambulatory Visit: Admitting: Internal Medicine

## 2024-09-11 LAB — URINE CULTURE

## 2024-09-16 ENCOUNTER — Encounter: Payer: Self-pay | Admitting: Gastroenterology

## 2024-09-16 NOTE — Anesthesia Preprocedure Evaluation (Signed)
 Anesthesia Evaluation  Patient identified by MRN, date of birth, ID band Patient awake    Reviewed: Allergy & Precautions, H&P , NPO status , Patient's Chart, lab work & pertinent test results  History of Anesthesia Complications (+) PONV and history of anesthetic complications  Airway Mallampati: IV  TM Distance: <3 FB Neck ROM: Full    Dental no notable dental hx. (+) Caps No caps on central incisors:   Pulmonary neg pulmonary ROS   Pulmonary exam normal breath sounds clear to auscultation       Cardiovascular negative cardio ROS Normal cardiovascular exam Rhythm:Regular Rate:Normal     Neuro/Psych  PSYCHIATRIC DISORDERS Anxiety     negative neurological ROS  negative psych ROS   GI/Hepatic negative GI ROS, Neg liver ROS,GERD  ,,  Endo/Other  negative endocrine ROS    Renal/GU negative Renal ROS  negative genitourinary   Musculoskeletal negative musculoskeletal ROS (+) Arthritis ,    Abdominal   Peds negative pediatric ROS (+)  Hematology negative hematology ROS (+)   Anesthesia Other Findings Acid reflux  Arthritis Personal history of chemotherapy Choriocarcinoma Anxiety  Complication of anesthesia PONV (postoperative nausea and vomiting)  Husband stays patient snores loudly and stops breathing at times. Patient's mother has been diagnosed with sleep apnea and wears CPAP.  Discussed w/patient and husband risks of untreated sleep apnea, and strongly urged sleep study.  Husband has been trying for a long time to get patient to have sleep study and has been worried about her having sleep apnea, so he is very much on board with this testing.  Loud snoring   Reproductive/Obstetrics negative OB ROS                              Anesthesia Physical Anesthesia Plan  ASA: 2  Anesthesia Plan: General   Post-op Pain Management:    Induction: Intravenous  PONV Risk Score and Plan:    Airway Management Planned: Natural Airway and Nasal Cannula  Additional Equipment:   Intra-op Plan:   Post-operative Plan:   Informed Consent: I have reviewed the patients History and Physical, chart, labs and discussed the procedure including the risks, benefits and alternatives for the proposed anesthesia with the patient or authorized representative who has indicated his/her understanding and acceptance.     Dental Advisory Given  Plan Discussed with: Anesthesiologist, CRNA and Surgeon  Anesthesia Plan Comments: (Patient consented for risks of anesthesia including but not limited to:  - adverse reactions to medications - risk of airway placement if required - damage to eyes, teeth, lips or other oral mucosa - nerve damage due to positioning  - sore throat or hoarseness - Damage to heart, brain, nerves, lungs, other parts of body or loss of life  Patient voiced understanding and assent.)         Anesthesia Quick Evaluation

## 2024-09-18 ENCOUNTER — Ambulatory Visit: Payer: Self-pay | Admitting: Anesthesiology

## 2024-09-18 ENCOUNTER — Encounter
Admission: RE | Disposition: A | Discharge status: Home / Self Care | Payer: Self-pay | Source: Home / Self Care | Attending: Gastroenterology

## 2024-09-18 ENCOUNTER — Encounter: Payer: Self-pay | Admitting: Gastroenterology

## 2024-09-18 ENCOUNTER — Other Ambulatory Visit: Payer: Self-pay

## 2024-09-18 ENCOUNTER — Ambulatory Visit
Admission: RE | Admit: 2024-09-18 | Discharge: 2024-09-18 | Disposition: A | Discharge status: Home / Self Care | Attending: Gastroenterology | Admitting: Gastroenterology

## 2024-09-18 DIAGNOSIS — Z1211 Encounter for screening for malignant neoplasm of colon: Secondary | ICD-10-CM | POA: Diagnosis present

## 2024-09-18 DIAGNOSIS — Z83719 Family history of colon polyps, unspecified: Secondary | ICD-10-CM | POA: Diagnosis present

## 2024-09-18 HISTORY — DX: Other complications of anesthesia, initial encounter: T88.59XA

## 2024-09-18 HISTORY — DX: Other specified postprocedural states: Z98.890

## 2024-09-18 HISTORY — DX: Nausea with vomiting, unspecified: R11.2

## 2024-09-18 HISTORY — DX: Snoring: R06.83

## 2024-09-18 HISTORY — PX: COLONOSCOPY: SHX5424

## 2024-09-18 SURGERY — COLONOSCOPY
Anesthesia: General | Site: Rectum

## 2024-09-18 MED ORDER — LIDOCAINE HCL (PF) 2 % IJ SOLN
INTRAMUSCULAR | Status: AC
  Filled 2024-09-18: qty 10

## 2024-09-18 MED ORDER — ONDANSETRON HCL 4 MG/2ML IJ SOLN
INTRAMUSCULAR | Status: AC
  Filled 2024-09-18: qty 2

## 2024-09-18 MED ORDER — STERILE WATER FOR IRRIGATION IR SOLN
Status: DC | PRN
  Administered 2024-09-18 (×2): 60 mL

## 2024-09-18 MED ORDER — STERILE WATER FOR IRRIGATION IR SOLN
Status: DC | PRN
  Administered 2024-09-18: 1000 mL

## 2024-09-18 MED ORDER — LACTATED RINGERS IV SOLN: INTRAVENOUS | Status: DC

## 2024-09-18 MED ORDER — SODIUM CHLORIDE 0.9 % IV SOLN: INTRAVENOUS | Status: DC

## 2024-09-18 MED ORDER — PROPOFOL 1000 MG/100ML IV EMUL
INTRAVENOUS | Status: AC
  Filled 2024-09-18: qty 100

## 2024-09-18 MED ORDER — PROPOFOL 10 MG/ML IV BOLUS
INTRAVENOUS | Status: DC | PRN
  Administered 2024-09-18 (×2): 100 mg via INTRAVENOUS
  Administered 2024-09-18: 140 ug/kg/min via INTRAVENOUS

## 2024-09-18 MED ORDER — ONDANSETRON HCL 4 MG/2ML IJ SOLN
4.0000 mg | Freq: Once | INTRAMUSCULAR | Status: AC
  Administered 2024-09-18: 4 mg via INTRAVENOUS

## 2024-09-18 SURGICAL SUPPLY — 16 items

## 2024-09-18 NOTE — H&P (Signed)
 Toni JONELLE Brooklyn, MD [facility], [address]  [place], [place] [address]  Main: [phone] Fax:  [phone] Pager: [phone]   Primary Care Physician:  Justus Leita DEL, MD Primary Gastroenterologist:  Dr. Corinn JONELLE Vang  Pre-Procedure History & Physical: HPI:  Toni Vang is a 58 y.o. female is here for an colonoscopy.   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   BREAST BIOPSY Right 04/09/2023   US  RT BREAST BX W LOC DEV 1ST LESION IMG BX SPEC US  GUIDE 04/09/2023 [facility]   DILATION AND CURETTAGE OF UTERUS  1989   LAPAROSCOPY     NASAL SINUS SURGERY     TOTAL ABDOMINAL HYSTERECTOMY  2009    Prior to Admission medications   Medication Sig Start Date End Date Taking? Authorizing Provider  cetirizine (ZYRTEC) 10 MG tablet Take 10 mg by mouth daily.   Yes [provider]  estradiol  (ESTRACE ) 1 MG tablet Take 1 mg by mouth daily.   Yes [provider]  famotidine  (PEPCID ) 20 MG tablet Take 20 mg by mouth 2 (two) times daily.   Yes [provider]  escitalopram  (LEXAPRO ) 10 MG tablet Take 1 tablet (10 mg total) by mouth daily. 03/28/20 06/03/20  Sebastian Sham, CNM  sertraline  (ZOLOFT ) 25 MG tablet Take 1 tablet (25 mg total) by mouth daily. 12/06/20 03/18/21  Justus Leita DEL, MD    Allergies as of 07/03/2024 - Review Complete 06/18/2024  Allergen Reaction Noted   Codeine Itching and Nausea And Vomiting 02/11/2019   Codeine sulfate Nausea Only 06/02/2015   Erythromycin Nausea Only 06/02/2015    Latex Rash 06/06/2015    Family History  Problem Relation Age of Onset   Diabetes Mother    Hypertension Mother    Breast cancer Mother 30   Dementia Mother    Cancer Mother    Hypertension Father    Cancer Father    Hearing loss Father    Cancer Maternal Aunt        ovarian   Diabetes Maternal Grandmother    Diabetes Maternal Grandfather    Intellectual disability Sister    Learning disabilities Sister    Vision loss Sister     Social History   Socioeconomic History   Marital status: Married    Spouse name: Moncerrat Burnstein   Number of children: 3   Years of education: Not on file   Highest education level: Not on file  Occupational History   Not on file  Tobacco Use   Smoking status: Never   Smokeless tobacco: Never  Vaping Use   Vaping status: Never Used  Substance and Sexual Activity   Alcohol use: Never   Drug use: Never   Sexual activity: Yes    Partners: Male    Birth control/protection: Surgical  Other Topics Concern   Not on file  Social History Narrative   Not on file   Social Drivers of Health   Financial Resource Strain: Low Risk  (10/09/2023)  Received from [facility]   Overall Financial Resource Strain (CARDIA)    Difficulty of Paying Living Expenses: Not very hard  Food Insecurity: No Food Insecurity (10/09/2023)   Received from [facility]   Hunger Vital Sign    Within the past 12 months, you worried that your food would run out before you got the money to buy more.: Never true    Within the past 12 months, the food you bought just didn't last and you didn't have money to get more.: Never true  Transportation Needs: No Transportation Needs (10/09/2023)   Received from [facility] - Transportation    In the past 12 months, has lack of transportation kept you from medical appointments or from getting medications?: No    Lack of Transportation (Non-Medical): No  Physical Activity:  Not on file  Stress: Not on file  Social Connections: Not on file  Intimate Partner Violence: Not on file    Review of Systems: See HPI, otherwise negative ROS  Physical Exam: BP (!) 161/80   Pulse 81   Temp 99.2 F (37.3 C) (Temporal)   Resp 15   Ht 5' 8 (1.727 m)   Wt 84.8 kg   SpO2 100%   BMI 28.43 kg/m  General:   Alert,  pleasant and cooperative in NAD Head:  Normocephalic and atraumatic. Neck:  Supple; no masses or thyromegaly. Lungs:  Clear throughout to auscultation.    Heart:  Regular rate and rhythm. Abdomen:  Soft, nontender and nondistended. Normal bowel sounds, without guarding, and without rebound.   Neurologic:  Alert and  oriented x4;  grossly normal neurologically.  Impression/Plan: Toni Vang is here for an colonoscopy to be performed for colon cancer screening  Risks, benefits, limitations, and alternatives regarding  colonoscopy have been reviewed with the patient.  Questions have been answered.  All parties agreeable.   Toni Brooklyn, MD  09/18/2024, 7:43 AM

## 2024-09-18 NOTE — Anesthesia Postprocedure Evaluation (Signed)
 Anesthesia Post Note  Patient: Toni Vang  Procedure(s) Performed: COLONOSCOPY (Rectum)  Patient location during evaluation: PACU Anesthesia Type: General Level of consciousness: awake and alert Pain management: pain level controlled Vital Signs Assessment: post-procedure vital signs reviewed and stable Respiratory status: spontaneous breathing, nonlabored ventilation, respiratory function stable and patient connected to nasal cannula oxygen Cardiovascular status: blood pressure returned to baseline and stable Postop Assessment: no apparent nausea or vomiting Anesthetic complications: no   No notable events documented.   Last Vitals:  Vitals:   09/18/24 0819 09/18/24 0828  BP: 108/67 129/71  Pulse: 78 74  Resp: 14 17  Temp: (!) 36.1 C (!) 36.1 C  SpO2: 99% 100%    Last Pain:  Vitals:   09/18/24 0828  TempSrc:   PainSc: 0-No pain                 Suhayla Chisom C Margeart Allender

## 2024-09-18 NOTE — Transfer of Care (Signed)
 Immediate Anesthesia Transfer of Care Note  Patient: Toni Vang  Procedure(s) Performed: COLONOSCOPY (Rectum)  Patient Location: PACU  Anesthesia Type: General  Level of Consciousness: awake, alert  and patient cooperative  Airway and Oxygen Therapy: Patient Spontanous Breathing   Post-op Assessment: Post-op Vital signs reviewed, Patient's Cardiovascular Status Stable, Respiratory Function Stable, Patent Airway and No signs of Nausea or vomiting  Post-op Vital Signs: Reviewed and stable  Complications: No notable events documented.

## 2024-09-18 NOTE — Op Note (Signed)
 [facility] Patient Name: Toni Vang Procedure Date: 09/18/2024 7:14 AM MRN: [id number] Account #: [id number] Date of Birth: 02-Jul-1966 Admit Type: Outpatient Age: 58 Room: [facility] OR ROOM 01 Gender: Female Note Status: Finalized Instrument Name: Peds Colonoscope [id number] Procedure:             Colonoscopy Indications:           Screening for colorectal malignant neoplasm, This is                         the patient's first colonoscopy Providers:             Corinn Jess Brooklyn MD, MD Referring MD:          Leita Adie, MD (Referring MD) Medicines:             General Anesthesia Complications:         No immediate complications. Estimated blood loss: None. Procedure:             Pre-Anesthesia Assessment:                        - Prior to the procedure, a History and Physical was                         performed, and patient medications and allergies were                         reviewed. The patient is competent. The risks and                         benefits of the procedure and the sedation options and                         risks were discussed with the patient. All questions                         were answered and informed consent was obtained.                         Patient identification and proposed procedure were                         verified by the physician, the nurse, the                         anesthesiologist, the anesthetist and the technician                         in the pre-procedure area in the procedure room in the                         endoscopy suite. Mental Status Examination: alert and                         oriented. Airway Examination: normal oropharyngeal                         airway and neck mobility. Respiratory Examination:  clear to auscultation. CV Examination: normal.                         Prophylactic Antibiotics: The patient does not require                          prophylactic antibiotics. Prior Anticoagulants: The                         patient has taken no anticoagulant or antiplatelet                         agents. ASA Grade Assessment: II - A patient with mild                         systemic disease. After reviewing the risks and                         benefits, the patient was deemed in satisfactory                         condition to undergo the procedure. The anesthesia                         plan was to use general anesthesia. Immediately prior                         to administration of medications, the patient was                         re-assessed for adequacy to receive sedatives. The                         heart rate, respiratory rate, oxygen saturations,                         blood pressure, adequacy of pulmonary ventilation, and                         response to care were monitored throughout the                         procedure. The physical status of the patient was                         re-assessed after the procedure.                        After obtaining informed consent, the colonoscope was                         passed under direct vision. Throughout the procedure,                         the patient's blood pressure, pulse, and oxygen                         saturations were monitored continuously. The  Colonoscope was introduced through the anus and                         advanced to the the terminal ileum, with                         identification of the appendiceal orifice and IC                         valve. The colonoscopy was performed without                         difficulty. The patient tolerated the procedure well.                         The quality of the bowel preparation was evaluated                         using the BBPS [facility] Bowel Preparation Scale) with                         scores of: Right Colon = 3, Transverse Colon = 3 and                         Left  Colon = 3 (entire mucosa seen well with no                         residual staining, small fragments of stool or opaque                         liquid). The total BBPS score equals 9. The terminal                         ileum, ileocecal valve, appendiceal orifice, and                         rectum were photographed. Findings:      The perianal and digital rectal examinations were normal. Pertinent       negatives include normal sphincter tone and no palpable rectal lesions.      The terminal ileum appeared normal.      The retroflexed view of the distal rectum and anal verge was normal and       showed no anal or rectal abnormalities.      The entire examined colon appeared normal. Impression:            - The examined portion of the ileum was normal.                        - The distal rectum and anal verge are normal on                         retroflexion view.                        - The entire examined colon is normal.                        -  No specimens collected. Recommendation:        - Discharge patient to home (with escort).                        - Resume previous diet today.                        - Continue present medications.                        - Repeat colonoscopy in 10 years for screening                         purposes. Procedure Code(s):     --- Professional ---                        H9878, Colorectal cancer screening; colonoscopy on                         individual not meeting criteria for high risk Diagnosis Code(s):     --- Professional ---                        Z12.11, Encounter for screening for malignant neoplasm                         of colon CPT copyright 2022 American Medical Association. All rights reserved. The codes documented in this report are preliminary and upon coder review may  be revised to meet current compliance requirements. Dr. Corinn Brooklyn Corinn Jess Brooklyn MD, MD 09/18/2024 8:19:54 AM This report has been signed  electronically. Number of Addenda: 0 Note Initiated On: 09/18/2024 7:14 AM Scope Withdrawal Time: 0 hours 11 minutes 18 seconds  Total Procedure Duration: 0 hours 15 minutes 59 seconds  Estimated Blood Loss:  Estimated blood loss: none.      [facility]

## 2024-10-30 ENCOUNTER — Other Ambulatory Visit: Payer: Self-pay | Admitting: Internal Medicine

## 2024-10-30 DIAGNOSIS — Z1231 Encounter for screening mammogram for malignant neoplasm of breast: Secondary | ICD-10-CM

## 2024-11-10 ENCOUNTER — Encounter: Payer: Self-pay | Admitting: Internal Medicine

## 2024-11-10 ENCOUNTER — Ambulatory Visit: Admitting: Internal Medicine

## 2024-11-10 VITALS — BP 150/88 | HR 92 | Temp 98.3°F | Ht 68.0 in | Wt 193.0 lb

## 2024-11-10 DIAGNOSIS — J0100 Acute maxillary sinusitis, unspecified: Secondary | ICD-10-CM

## 2024-11-10 DIAGNOSIS — R051 Acute cough: Secondary | ICD-10-CM

## 2024-11-10 MED ORDER — BENZONATATE 100 MG PO CAPS: 100.0000 mg | ORAL_CAPSULE | Freq: Three times a day (TID) | ORAL | 0 refills | Status: AC

## 2024-11-10 MED ORDER — AZITHROMYCIN 250 MG PO TABS: ORAL_TABLET | ORAL | 0 refills | Status: AC

## 2024-11-10 NOTE — Progress Notes (Signed)
 Date:  11/10/2024   Name:  Toni Vang   DOB:  1966-12-03   MRN:  [id number]   Chief Complaint: Cough (Neg covid and flu test at home. Coughing up yellow mucous. )  Cough This is a new problem. Episode onset: X2 days. The problem has been gradually worsening. The problem occurs hourly. The cough is Productive of sputum (green/ yellow). Associated symptoms include ear congestion, myalgias, nasal congestion, postnasal drip and a sore throat. Pertinent negatives include no chest pain, chills, ear pain, fever, headaches, rash or wheezing. She has tried OTC cough suppressant for the symptoms. The treatment provided mild relief.    Review of Systems  Constitutional:  Negative for chills, fatigue and fever.  HENT:  Positive for postnasal drip and sore throat. Negative for ear pain.   Respiratory:  Positive for cough. Negative for chest tightness and wheezing.   Cardiovascular:  Negative for chest pain and palpitations.  Musculoskeletal:  Positive for myalgias.  Skin:  Negative for rash.  Neurological:  Negative for headaches.  Psychiatric/Behavioral:  Negative for dysphoric mood and sleep disturbance. The patient is not nervous/anxious.      Lab Results  Component Value Date   NA 140 11/28/2021   K 5.3 (H) 11/28/2021   CO2 26 11/28/2021   GLUCOSE 82 11/28/2021   BUN 7 11/28/2021   CREATININE 0.67 11/28/2021   CALCIUM 9.6 11/28/2021   EGFR 103 11/28/2021   GFRNONAA 102 12/06/2020   Lab Results  Component Value Date   CHOL 278 (H) 04/10/2021   HDL 52 04/10/2021   LDLCALC 171 (H) 04/10/2021   TRIG 292 (H) 04/10/2021   CHOLHDL 5.3 (H) 04/10/2021   Lab Results  Component Value Date   TSH 0.925 04/10/2021   No results found for: HGBA1C Lab Results  Component Value Date   WBC 7.4 04/10/2021   HGB 12.5 04/10/2021   HCT 38.0 04/10/2021   MCV 86 04/10/2021   PLT 325 04/10/2021   Lab Results  Component Value Date   ALT 16 11/28/2021   AST 18 11/28/2021   ALKPHOS 109  11/28/2021   BILITOT 0.2 11/28/2021   Lab Results  Component Value Date   VD25OH 33.8 05/08/2018     Patient Active Problem List   Diagnosis Date Noted   Asymptomatic microscopic hematuria 12/18/2023   Recurrent UTI 08/23/2023   Atrophic vaginitis 08/16/2023   Menopause syndrome 07/05/2022   It band syndrome, 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/2021   Primary osteoarthritis of right knee 04/13/2021   Hyperlipidemia, mixed 04/11/2021   S/P TAH (total abdominal hysterectomy) 12/06/2020   Environmental and seasonal allergies 12/06/2020   Chronic pain of both knees 12/06/2020   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   BREAST BIOPSY Right 04/09/2023   US  RT BREAST BX W LOC DEV 1ST LESION IMG BX SPEC US  GUIDE 04/09/2023 [facility]   COLONOSCOPY N/A 09/18/2024   Procedure: COLONOSCOPY;  Surgeon: Unk Corinn Skiff, MD;  Location: [facility];  Service: Endoscopy;  Laterality: N/A;  DILATION AND CURETTAGE OF UTERUS  1989   LAPAROSCOPY     NASAL SINUS SURGERY     TOTAL ABDOMINAL HYSTERECTOMY  2009    Social History   Tobacco Use   Smoking status: Never   Smokeless tobacco: Never  Vaping Use   Vaping status: Never Used  Substance Use Topics   Alcohol use: Never   Drug use: Never     Medication list has been reviewed and updated.  Current Meds  Medication Sig   azithromycin  (ZITHROMAX  Z-PAK) 250 MG tablet UAD   benzonatate  (TESSALON ) 100 MG capsule Take 1 capsule (100 mg total) by mouth 3 (three) times daily for 10 days.    cetirizine (ZYRTEC) 10 MG tablet Take 10 mg by mouth daily.   estradiol  (ESTRACE ) 1 MG tablet Take 1 mg by mouth daily.   famotidine  (PEPCID ) 20 MG tablet Take 20 mg by mouth 2 (two) times daily.       11/10/2024    4:06 PM 09/09/2024    3:45 PM 06/18/2024   10:16 AM 12/18/2023   11:24 AM  GAD 7 : Generalized Anxiety Score  Nervous, Anxious, on Edge 0 0 0 2  Control/stop worrying 0 0 0 2  Worry too much - different things 0 0 0 2  Trouble relaxing 0 0 0 2  Restless 0 0 0 1  Easily annoyed or irritable 0 0 0 1  Afraid - awful might happen 0 0 0 2  Total GAD 7 Score 0 0 0 12  Anxiety Difficulty Not difficult at all Not difficult at all Not difficult at all Somewhat difficult       11/10/2024    4:06 PM 09/09/2024    3:45 PM 06/18/2024   10:16 AM  Depression screen PHQ 2/9  Decreased Interest 0 0 0  Down, Depressed, Hopeless 0 0 0  PHQ - 2 Score 0 0 0  Altered sleeping 0 0 0  Tired, decreased energy 0 0 0  Change in appetite 0 0 0  Feeling bad or failure about yourself  0 0 0  Trouble concentrating 0 0 0  Moving slowly or fidgety/restless 0 0 0  Suicidal thoughts 0 0 0  PHQ-9 Score 0 0  0   Difficult doing work/chores Not difficult at all Not difficult at all Not difficult at all     Data saved with a previous flowsheet row definition    BP Readings from Last 3 Encounters:  11/10/24 (!) 150/88  09/18/24 129/71  09/09/24 124/74    Physical Exam Vitals and nursing note reviewed.  Constitutional:      General: She is not in acute distress.    Appearance: Normal appearance. She is well-developed.  HENT:     Head: Normocephalic and atraumatic.     Right Ear: Ear canal and external ear normal. Tympanic membrane is retracted. Tympanic membrane is not erythematous.     Left Ear: Ear canal and external ear normal. Tympanic membrane is retracted. Tympanic membrane is not erythematous.     Nose:     Right Sinus: Maxillary sinus tenderness present. No frontal sinus tenderness.      Left Sinus: Maxillary sinus tenderness present. No frontal sinus tenderness.     Mouth/Throat:     Mouth: No oral lesions.     Pharynx: Uvula midline. Oropharyngeal exudate and posterior oropharyngeal erythema present.  Cardiovascular:     Rate and Rhythm: Normal rate and regular rhythm.     Heart sounds:  Normal heart sounds.  Pulmonary:     Effort: Pulmonary effort is normal. No respiratory distress.     Breath sounds: Normal breath sounds. No wheezing, rhonchi or rales.  Lymphadenopathy:     Cervical: Cervical adenopathy present.  Skin:    General: Skin is warm and dry.     Findings: No rash.  Neurological:     Mental Status: She is alert and oriented to person, place, and time.  Psychiatric:        Attention and Perception: Attention normal.        Mood and Affect: Mood normal.        Behavior: Behavior normal.     Wt Readings from Last 3 Encounters:  11/10/24 193 lb (87.5 kg)  09/18/24 187 lb (84.8 kg)  09/09/24 189 lb (85.7 kg)    BP (!) 150/88   Pulse 92   Temp 98.3 F (36.8 C)   Ht 5' 8 (1.727 m)   Wt 193 lb (87.5 kg)   SpO2 98%   BMI 29.35 kg/m   Assessment and Plan:  Problem List Items Addressed This Visit   None Visit Diagnoses       Acute non-recurrent maxillary sinusitis    -  Primary   Continue decongestants like sudafed and fluids   Relevant Medications   azithromycin  (ZITHROMAX  Z-PAK) 250 MG tablet   benzonatate  (TESSALON ) 100 MG capsule     Acute cough       Relevant Medications   benzonatate  (TESSALON ) 100 MG capsule       No follow-ups on file.    Leita HILARIO Adie, MD [facility] [place]

## 2024-11-16 ENCOUNTER — Other Ambulatory Visit: Payer: Self-pay | Admitting: Internal Medicine

## 2024-11-16 ENCOUNTER — Encounter: Payer: Self-pay | Admitting: Internal Medicine

## 2024-11-16 DIAGNOSIS — R051 Acute cough: Secondary | ICD-10-CM

## 2024-11-16 MED ORDER — PROMETHAZINE-DM 6.25-15 MG/5ML PO SYRP: 5.0000 mL | ORAL_SOLUTION | Freq: Four times a day (QID) | ORAL | 0 refills | Status: DC | PRN

## 2024-11-16 NOTE — Telephone Encounter (Signed)
 Please review.  KP

## 2024-11-16 NOTE — Progress Notes (Unsigned)
 Date:  11/16/2024   Name:  Toni Vang   DOB:  1966/09/01   MRN:  [id number]   Chief Complaint: No chief complaint on file.  HPI  Review of Systems   Lab Results  Component Value Date   NA 140 11/28/2021   K 5.3 (H) 11/28/2021   CO2 26 11/28/2021   GLUCOSE 82 11/28/2021   BUN 7 11/28/2021   CREATININE 0.67 11/28/2021   CALCIUM 9.6 11/28/2021   EGFR 103 11/28/2021   GFRNONAA 102 12/06/2020   Lab Results  Component Value Date   CHOL 278 (H) 04/10/2021   HDL 52 04/10/2021   LDLCALC 171 (H) 04/10/2021   TRIG 292 (H) 04/10/2021   CHOLHDL 5.3 (H) 04/10/2021   Lab Results  Component Value Date   TSH 0.925 04/10/2021   No results found for: HGBA1C Lab Results  Component Value Date   WBC 7.4 04/10/2021   HGB 12.5 04/10/2021   HCT 38.0 04/10/2021   MCV 86 04/10/2021   PLT 325 04/10/2021   Lab Results  Component Value Date   ALT 16 11/28/2021   AST 18 11/28/2021   ALKPHOS 109 11/28/2021   BILITOT 0.2 11/28/2021   Lab Results  Component Value Date   VD25OH 33.8 05/08/2018     Patient Active Problem List   Diagnosis Date Noted   Asymptomatic microscopic hematuria 12/18/2023   Recurrent UTI 08/23/2023   Atrophic vaginitis 08/16/2023   Menopause syndrome 07/05/2022   It band syndrome, right 07/20/2021   Primary osteoarthritis of right knee 04/13/2021   Hyperlipidemia, mixed 04/11/2021   S/P TAH (total abdominal hysterectomy) 12/06/2020   Environmental and seasonal allergies 12/06/2020   Chronic pain of both knees 12/06/2020   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   BREAST BIOPSY Right 04/09/2023   US  RT BREAST BX W LOC DEV 1ST LESION IMG BX SPEC US  GUIDE 04/09/2023 [facility]   COLONOSCOPY N/A 09/18/2024   Procedure: COLONOSCOPY;  Surgeon: Unk Corinn Skiff, MD;  Location: [facility];  Service: Endoscopy;  Laterality: N/A;   DILATION AND CURETTAGE OF UTERUS  1989   LAPAROSCOPY     NASAL SINUS SURGERY     TOTAL ABDOMINAL HYSTERECTOMY  2009    Social History   Tobacco Use   Smoking status: Never   Smokeless tobacco: Never  Vaping Use   Vaping status: Never Used  Substance Use Topics   Alcohol use: Never   Drug use: Never     Medication list has been reviewed and updated.  No outpatient medications have been marked as taking for the 11/16/24 encounter (Orders Only) with Justus Leita DEL, MD.       11/10/2024    4:06 PM 09/09/2024    3:45 PM 06/18/2024   10:16 AM 12/18/2023   11:24 AM  GAD 7 : Generalized Anxiety Score  Nervous, Anxious, on Edge 0 0 0 2  Control/stop worrying 0 0 0 2  Worry too much - different things 0 0 0 2  Trouble relaxing 0 0 0 2  Restless 0 0 0 1  Easily annoyed or irritable 0 0 0 1  Afraid - awful might happen 0 0 0 2  Total GAD 7 Score 0 0 0 12  Anxiety Difficulty Not difficult at all Not difficult at all Not difficult at all Somewhat difficult       11/10/2024    4:06 PM 09/09/2024    3:45 PM 06/18/2024   10:16 AM  Depression screen PHQ 2/9  Decreased Interest 0 0 0  Down, Depressed, Hopeless 0 0 0  PHQ - 2 Score 0 0 0  Altered sleeping 0 0 0  Tired, decreased energy 0 0 0  Change in appetite 0 0 0  Feeling bad or failure about yourself  0 0 0  Trouble concentrating 0 0 0  Moving slowly or fidgety/restless 0 0 0  Suicidal thoughts 0 0 0  PHQ-9 Score 0 0  0   Difficult doing work/chores Not difficult at all Not difficult at all Not difficult at all     Data saved with a previous flowsheet row definition     BP Readings from Last 3 Encounters:  11/10/24 (!) 150/88  09/18/24 129/71  09/09/24 124/74    Physical Exam  Wt Readings from Last 3 Encounters:  11/10/24 193 lb (87.5 kg)  09/18/24 187 lb (84.8 kg)  09/09/24 189 lb (85.7 kg)    There were no vitals taken for this visit.  Assessment and Plan:  Problem List Items Addressed This Visit   None   No follow-ups on file.    Leita HILARIO Adie, MD [facility] [place]

## 2024-12-01 ENCOUNTER — Other Ambulatory Visit: Payer: Self-pay | Admitting: Internal Medicine

## 2024-12-01 DIAGNOSIS — R051 Acute cough: Secondary | ICD-10-CM

## 2024-12-01 MED ORDER — ALBUTEROL SULFATE HFA 108 (90 BASE) MCG/ACT IN AERS: 2.0000 | INHALATION_SPRAY | Freq: Four times a day (QID) | RESPIRATORY_TRACT | 0 refills | Status: DC | PRN

## 2024-12-01 MED ORDER — PROMETHAZINE-DM 6.25-15 MG/5ML PO SYRP: 5.0000 mL | ORAL_SOLUTION | Freq: Four times a day (QID) | ORAL | 0 refills | Status: AC | PRN

## 2024-12-01 NOTE — Telephone Encounter (Signed)
Please review and advise patient.

## 2024-12-01 NOTE — Telephone Encounter (Signed)
 Please review.  KP

## 2024-12-01 NOTE — Progress Notes (Unsigned)
 "   Date:  12/01/2024   Name:  Toni Vang   DOB:  03-18-1966   MRN:  [id number]   Chief Complaint: No chief complaint on file.  HPI  Review of Systems   Lab Results  Component Value Date   NA 140 11/28/2021   K 5.3 (H) 11/28/2021   CO2 26 11/28/2021   GLUCOSE 82 11/28/2021   BUN 7 11/28/2021   CREATININE 0.67 11/28/2021   CALCIUM 9.6 11/28/2021   EGFR 103 11/28/2021   GFRNONAA 102 12/06/2020   Lab Results  Component Value Date   CHOL 278 (H) 04/10/2021   HDL 52 04/10/2021   LDLCALC 171 (H) 04/10/2021   TRIG 292 (H) 04/10/2021   CHOLHDL 5.3 (H) 04/10/2021   Lab Results  Component Value Date   TSH 0.925 04/10/2021   No results found for: HGBA1C Lab Results  Component Value Date   WBC 7.4 04/10/2021   HGB 12.5 04/10/2021   HCT 38.0 04/10/2021   MCV 86 04/10/2021   PLT 325 04/10/2021   Lab Results  Component Value Date   ALT 16 11/28/2021   AST 18 11/28/2021   ALKPHOS 109 11/28/2021   BILITOT 0.2 11/28/2021   Lab Results  Component Value Date   VD25OH 33.8 05/08/2018     Patient Active Problem List   Diagnosis Date Noted   Asymptomatic microscopic hematuria 12/18/2023   Recurrent UTI 08/23/2023   Atrophic vaginitis 08/16/2023   Menopause syndrome 07/05/2022   It band syndrome, right 07/20/2021   Primary osteoarthritis of right knee 04/13/2021   Hyperlipidemia, mixed 04/11/2021   S/P TAH (total abdominal hysterectomy) 12/06/2020   Environmental and seasonal allergies 12/06/2020   Chronic pain of both knees 12/06/2020   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   BREAST BIOPSY Right 04/09/2023   US  RT BREAST BX W LOC DEV  1ST LESION IMG BX SPEC US  GUIDE 04/09/2023 [facility]   COLONOSCOPY N/A 09/18/2024   Procedure: COLONOSCOPY;  Surgeon: Unk Corinn Skiff, MD;  Location: [facility];  Service: Endoscopy;  Laterality: N/A;   DILATION AND CURETTAGE OF UTERUS  1989   LAPAROSCOPY     NASAL SINUS SURGERY     TOTAL ABDOMINAL HYSTERECTOMY  2009    Social History[2]   Medication list has been reviewed and updated.  Active Medications[3]     11/10/2024    4:06 PM 09/09/2024    3:45 PM 06/18/2024   10:16 AM 12/18/2023   11:24 AM  GAD 7 : Generalized Anxiety Score  Nervous, Anxious, on Edge 0 0 0 2  Control/stop worrying 0 0 0 2  Worry too much - different things 0 0 0 2  Trouble relaxing 0 0 0 2  Restless 0 0 0 1  Easily annoyed or irritable 0 0 0 1  Afraid - awful might happen 0 0 0 2  Total GAD 7 Score 0 0 0 12  Anxiety Difficulty Not difficult at all Not difficult at all Not difficult at all Somewhat difficult       11/10/2024    4:06 PM 09/09/2024    3:45 PM 06/18/2024   10:16 AM  Depression screen PHQ  2/9  Decreased Interest 0 0 0  Down, Depressed, Hopeless 0 0 0  PHQ - 2 Score 0 0 0  Altered sleeping 0 0 0  Tired, decreased energy 0 0 0  Change in appetite 0 0 0  Feeling bad or failure about yourself  0 0 0  Trouble concentrating 0 0 0  Moving slowly or fidgety/restless 0 0 0  Suicidal thoughts 0 0 0  PHQ-9 Score 0 0  0   Difficult doing work/chores Not difficult at all Not difficult at all Not difficult at all     Data saved with a previous flowsheet row definition    BP Readings from Last 3 Encounters:  11/10/24 (!) 150/88  09/18/24 129/71  09/09/24 124/74    Physical Exam  Wt Readings from Last 3 Encounters:  11/10/24 193 lb (87.5 kg)  09/18/24 187 lb (84.8 kg)  09/09/24 189 lb (85.7 kg)    There were no vitals taken for this visit.  Assessment and Plan:  Problem List Items Addressed This Visit   None   No follow-ups on file.    Toni HILARIO Adie,  MD [facility] [place]           [1]  Allergies Allergen Reactions   Codeine Itching and Nausea And Vomiting   Codeine Sulfate Nausea Only   Erythromycin Nausea Only   Latex Rash  [2]  Social History Tobacco Use   Smoking status: Never   Smokeless tobacco: Never  Vaping Use   Vaping status: Never Used  Substance Use Topics   Alcohol use: Never   Drug use: Never  [3]  No outpatient medications have been marked as taking for the 12/01/24 encounter (Orders Only) with Vang Toni DEL, MD.   "

## 2024-12-08 ENCOUNTER — Ambulatory Visit
Admission: RE | Admit: 2024-12-08 | Discharge: 2024-12-08 | Disposition: A | Discharge status: Home / Self Care | Source: Ambulatory Visit | Attending: Internal Medicine | Admitting: Internal Medicine

## 2024-12-08 DIAGNOSIS — Z1231 Encounter for screening mammogram for malignant neoplasm of breast: Secondary | ICD-10-CM | POA: Diagnosis present

## 2024-12-10 ENCOUNTER — Telehealth: Admitting: Physician Assistant

## 2024-12-10 ENCOUNTER — Encounter

## 2024-12-10 DIAGNOSIS — J019 Acute sinusitis, unspecified: Secondary | ICD-10-CM | POA: Diagnosis not present

## 2024-12-10 DIAGNOSIS — B9689 Other specified bacterial agents as the cause of diseases classified elsewhere: Secondary | ICD-10-CM

## 2024-12-10 MED ORDER — PREDNISONE 20 MG PO TABS: 40.0000 mg | ORAL_TABLET | Freq: Every day | ORAL | 0 refills | Status: AC

## 2024-12-10 MED ORDER — AMOXICILLIN-POT CLAVULANATE 875-125 MG PO TABS: 1.0000 | ORAL_TABLET | Freq: Two times a day (BID) | ORAL | 0 refills | Status: AC

## 2024-12-10 NOTE — Progress Notes (Signed)
 " Virtual Visit Consent   Toni Vang, you are scheduled for a virtual visit with a [facility] [employer] provider today. Just as with appointments in the office, your consent must be obtained to participate. Your consent will be active for this visit and any virtual visit you may have with one of our providers in the next 365 days. If you have a MyChart account, a copy of this consent can be sent to you electronically.  As this is a virtual visit, video technology does not allow for your provider to perform a traditional examination. This may limit your provider's ability to fully assess your condition. If your provider identifies any concerns that need to be evaluated in person or the need to arrange testing (such as labs, EKG, etc.), we will make arrangements to do so. Although advances in technology are sophisticated, we cannot ensure that it will always work on either your end or our end. If the connection with a video visit is poor, the visit may have to be switched to a telephone visit. With either a video or telephone visit, we are not always able to ensure that we have a secure connection.  By engaging in this virtual visit, you consent to the provision of healthcare and authorize for your insurance to be billed (if applicable) for the services provided during this visit. Depending on your insurance coverage, you may receive a charge related to this service.  I need to obtain your verbal consent now. Are you willing to proceed with your visit today? Toni Vang has provided verbal consent on 12/10/2024 for a virtual visit (video or telephone). Toni CHRISTELLA Dickinson, PA-C  Date: 12/10/2024 8:14 AM   Virtual Visit via Video Note   I, Toni Vang, connected with  Toni Vang  ([id number], Jul 06, 1966) on 12/10/2024 at  8:00 AM EST by a video-enabled telemedicine application and verified that I am speaking with the correct person using two identifiers.  Location: Patient: Virtual Visit Location  Patient: Home Provider: Virtual Visit Location Provider: Home Office   I discussed the limitations of evaluation and management by telemedicine and the availability of in person appointments. The patient expressed understanding and agreed to proceed.    History of Present Illness: Toni Vang is a 59 y.o. who identifies as a female who was assigned female at birth, and is being seen today for sinus pain and cough.  HPI: URI  This is a recurrent problem. The current episode started more than 1 month ago (Cough has been present since end of Nov 2025. Saw PCP on 11/10/24 and given Zpack, tessaoln, cough syrup. Did not improve so Albuterol  inhaler was added on 12/01/24. Still not improving and over last 7 days sinus symptoms started). The problem has been unchanged. There has been no fever. Associated symptoms include congestion, coughing (dry), headaches, a plugged ear sensation, rhinorrhea, sinus pain, a sore throat and swollen glands. Pertinent negatives include no chest pain, diarrhea, ear pain, nausea, vomiting or wheezing. She has tried steam and decongestant (sudafed) for the symptoms. The treatment provided no relief.   Negative at home Covid test. 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  Resting comfortably at home.  Head is normocephalic, atraumatic.  No labored breathing.  Speech is clear and coherent with logical content.  Patient is alert and oriented at baseline.    Assessment and Plan: 1. Acute bacterial sinusitis (Primary) - amoxicillin -clavulanate (AUGMENTIN ) 875-125 MG tablet; Take 1 tablet by mouth 2 (two) times daily.  Dispense: 14 tablet; Refill: 0 - predniSONE  (DELTASONE ) 20 MG tablet; Take 2 tablets (40 mg total) by mouth daily with breakfast.  Dispense: 10 tablet; Refill: 0  - Worsening symptoms that have not responded to OTC medications.  - Will give Augmentin  and Prednisone  - Continue allergy medications.  - Steam and humidifier can help - Stay well hydrated and get plenty of rest.  - Seek in person evaluation if no symptom improvement or if symptoms worsen   Follow Up Instructions: I discussed the assessment and treatment plan with the patient. The patient was provided an opportunity to ask questions and all were answered. The patient agreed with the plan and demonstrated an understanding of the instructions.  A copy of instructions were sent to the patient via MyChart unless otherwise noted below.    The patient was advised to call back or seek an in-person evaluation if the symptoms worsen or if the condition fails to improve as anticipated.    Toni CHRISTELLA Dickinson, PA-C     [1]  Allergies Allergen Reactions   Codeine Itching and Nausea And Vomiting   Codeine Sulfate Nausea Only   Erythromycin Nausea Only   Latex Rash  [2]  Current Outpatient Medications:    albuterol  (VENTOLIN  HFA) 108 (90 Base) MCG/ACT inhaler, Inhale 2 puffs into the lungs every 6 (six) hours as needed for shortness of breath (cough)., Disp: 1 each, Rfl: 0   amoxicillin -clavulanate (AUGMENTIN ) 875-125 MG tablet, Take 1 tablet by mouth 2 (two) times daily., Disp:  14 tablet, Rfl: 0   cetirizine (ZYRTEC) 10 MG tablet, Take 10 mg by mouth daily., Disp: , Rfl:    estradiol  (ESTRACE ) 1 MG tablet, Take 1 mg by mouth daily., Disp: , Rfl:    famotidine  (PEPCID ) 20 MG tablet, Take 20 mg by mouth 2 (two) times daily., Disp: , Rfl:    predniSONE  (DELTASONE ) 20 MG tablet, Take 2 tablets (40 mg total) by mouth daily with breakfast., Disp: 10 tablet, Rfl: 0   promethazine -dextromethorphan (PROMETHAZINE -DM) 6.25-15 MG/5ML syrup, Take 5 mLs by mouth 4 (four) times daily as needed for up to 9 days for cough., Disp: 118 mL, Rfl: 0  "

## 2024-12-10 NOTE — Patient Instructions (Signed)
 " Toni Vang, thank you for joining Delon CHRISTELLA Dickinson, PA-C for today's virtual visit.  While this provider is not your primary care provider (PCP), if your PCP is located in our provider database this encounter information will be shared with them immediately following your visit.   A [facility] MyChart account gives you access to today's visit and all your visits, tests, and labs performed at [facility]  click here if you don't have a [facility] MyChart account or go to [email or web address]  Consent: (Patient) Toni Vang provided verbal consent for this virtual visit at the beginning of the encounter.  Current Medications:  Current Outpatient Medications:    albuterol  (VENTOLIN  HFA) 108 (90 Base) MCG/ACT inhaler, Inhale 2 puffs into the lungs every 6 (six) hours as needed for shortness of breath (cough)., Disp: 1 each, Rfl: 0   amoxicillin -clavulanate (AUGMENTIN ) 875-125 MG tablet, Take 1 tablet by mouth 2 (two) times daily., Disp: 14 tablet, Rfl: 0   cetirizine (ZYRTEC) 10 MG tablet, Take 10 mg by mouth daily., Disp: , Rfl:    estradiol  (ESTRACE ) 1 MG tablet, Take 1 mg by mouth daily., Disp: , Rfl:    famotidine  (PEPCID ) 20 MG tablet, Take 20 mg by mouth 2 (two) times daily., Disp: , Rfl:    predniSONE  (DELTASONE ) 20 MG tablet, Take 2 tablets (40 mg total) by mouth daily with breakfast., Disp: 10 tablet, Rfl: 0   promethazine -dextromethorphan (PROMETHAZINE -DM) 6.25-15 MG/5ML syrup, Take 5 mLs by mouth 4 (four) times daily as needed for up to 9 days for cough., Disp: 118 mL, Rfl: 0   Medications ordered in this encounter:  Meds ordered this encounter  Medications   amoxicillin -clavulanate (AUGMENTIN ) 875-125 MG tablet    Sig: Take 1 tablet by mouth 2 (two) times daily.    Dispense:  14 tablet    Refill:  0    Supervising Provider:   LAMPTEY, PHILIP O [[id number]]   predniSONE  (DELTASONE ) 20 MG tablet    Sig: Take 2 tablets (40 mg total) by mouth daily with  breakfast.    Dispense:  10 tablet    Refill:  0    Supervising Provider:   BLAISE ALEENE KIDD [[id number]]     *If you need refills on other medications prior to your next appointment, please contact your pharmacy*  Follow-Up: Call back or seek an in-person evaluation if the symptoms worsen or if the condition fails to improve as anticipated.   Virtual Care [phone]  Other Instructions Sinus Infection, Adult A sinus infection, also called sinusitis, is inflammation of your sinuses. Sinuses are hollow spaces in the bones around your face. Your sinuses are located: Around your eyes. In the middle of your forehead. Behind your nose. In your cheekbones. Mucus normally drains out of your sinuses. When your nasal tissues become inflamed or swollen, mucus can become trapped or blocked. This allows bacteria, viruses, and fungi to grow, which leads to infection. Most infections of the sinuses are caused by a virus. A sinus infection can develop quickly. It can last for up to 4 weeks (acute) or for more than 12 weeks (chronic). A sinus infection often develops after a cold. What are the causes? This condition is caused by anything that creates swelling in the sinuses or stops mucus from draining. This includes: Allergies. Asthma. Infection from bacteria or viruses. Deformities or blockages in your nose or sinuses. Abnormal growths in the nose (nasal polyps). Pollutants, such as chemicals or irritants in  the air. Infection from fungi. This is rare. What increases the risk? You are more likely to develop this condition if you: Have a weak body defense system (immune system). Do a lot of swimming or diving. Overuse nasal sprays. Smoke. What are the signs or symptoms? The main symptoms of this condition are pain and a feeling of pressure around the affected sinuses. Other symptoms include: Stuffy nose or congestion that makes it difficult to breathe through your nose. Thick  yellow or greenish drainage from your nose. Tenderness, swelling, and warmth over the affected sinuses. A cough that may get worse at night. Decreased sense of smell and taste. Extra mucus that collects in the throat or the back of the nose (postnasal drip) causing a sore throat or bad breath. Tiredness (fatigue). Fever. How is this diagnosed? This condition is diagnosed based on: Your symptoms. Your medical history. A physical exam. Tests to find out if your condition is acute or chronic. This may include: Checking your nose for nasal polyps. Viewing your sinuses using a device that has a light (endoscope). Testing for allergies or bacteria. Imaging tests, such as an MRI or CT scan. In rare cases, a bone biopsy may be done to rule out more serious types of fungal sinus disease. How is this treated? Treatment for a sinus infection depends on the cause and whether your condition is chronic or acute. If caused by a virus, your symptoms should go away on their own within 10 days. You may be given medicines to relieve symptoms. They include: Medicines that shrink swollen nasal passages (decongestants). A spray that eases inflammation of the nostrils (topical intranasal corticosteroids). Rinses that help get rid of thick mucus in your nose (nasal saline washes). Medicines that treat allergies (antihistamines). Over-the-counter pain relievers. If caused by bacteria, your health care provider may recommend waiting to see if your symptoms improve. Most bacterial infections will get better without antibiotic medicine. You may be given antibiotics if you have: A severe infection. A weak immune system. If caused by narrow nasal passages or nasal polyps, surgery may be needed. Follow these instructions at home: Medicines Take, use, or apply over-the-counter and prescription medicines only as told by your health care provider. These may include nasal sprays. If you were prescribed an antibiotic  medicine, take it as told by your health care provider. Do not stop taking the antibiotic even if you start to feel better. Hydrate and humidify  Drink enough fluid to keep your urine pale yellow. Staying hydrated will help to thin your mucus. Use a cool mist humidifier to keep the humidity level in your home above 50%. Inhale steam for 10-15 minutes, 3-4 times a day, or as told by your health care provider. You can do this in the bathroom while a hot shower is running. Limit your exposure to cool or dry air. Rest Rest as much as possible. Sleep with your head raised (elevated). Make sure you get enough sleep each night. General instructions  Apply a warm, moist washcloth to your face 3-4 times a day or as told by your health care provider. This will help with discomfort. Use nasal saline washes as often as told by your health care provider. Wash your hands often with soap and water  to reduce your exposure to germs. If soap and water  are not available, use hand sanitizer. Do not smoke. Avoid being around people who are smoking (secondhand smoke). Keep all follow-up visits. This is important. Contact a health care provider if:  You have a fever. Your symptoms get worse. Your symptoms do not improve within 10 days. Get help right away if: You have a severe headache. You have persistent vomiting. You have severe pain or swelling around your face or eyes. You have vision problems. You develop confusion. Your neck is stiff. You have trouble breathing. These symptoms may be an emergency. Get help right away. Call 911. Do not wait to see if the symptoms will go away. Do not drive yourself to the hospital. Summary A sinus infection is soreness and inflammation of your sinuses. Sinuses are hollow spaces in the bones around your face. This condition is caused by nasal tissues that become inflamed or swollen. The swelling traps or blocks the flow of mucus. This allows bacteria, viruses, and  fungi to grow, which leads to infection. If you were prescribed an antibiotic medicine, take it as told by your health care provider. Do not stop taking the antibiotic even if you start to feel better. Keep all follow-up visits. This is important. This information is not intended to replace advice given to you by your health care provider. Make sure you discuss any questions you have with your health care provider. Document Revised: 10/31/2021 Document Reviewed: 10/31/2021 Elsevier Patient Education  2024 Elsevier Inc.   If you have been instructed to have an in-person evaluation today at a local Urgent Care facility, please use the link below. It will take you to a list of all of our available [facility], including address, phone number and hours of operation. Please do not delay care.  [facility]  If you or a family member do not have a primary care provider, use the link below to schedule a visit and establish care. When you choose a [facility] primary care physician or advanced practice provider, you gain a long-term partner in health. Find a Primary Care Provider  Learn more about [facility]'s in-office and virtual care options: [facility] - Get Care Now "

## 2024-12-17 ENCOUNTER — Encounter: Payer: Self-pay | Admitting: Physician Assistant

## 2024-12-17 ENCOUNTER — Encounter: Payer: Self-pay | Admitting: Family Medicine

## 2024-12-17 ENCOUNTER — Ambulatory Visit: Admitting: Family Medicine

## 2024-12-17 ENCOUNTER — Ambulatory Visit: Payer: Self-pay

## 2024-12-17 VITALS — BP 108/60 | HR 88 | Ht 68.0 in | Wt 194.0 lb

## 2024-12-17 DIAGNOSIS — R051 Acute cough: Secondary | ICD-10-CM

## 2024-12-17 DIAGNOSIS — R0982 Postnasal drip: Secondary | ICD-10-CM | POA: Diagnosis not present

## 2024-12-17 MED ORDER — PROMETHAZINE-DM 6.25-15 MG/5ML PO SYRP: 5.0000 mL | ORAL_SOLUTION | Freq: Four times a day (QID) | ORAL | 0 refills | Status: AC | PRN

## 2024-12-17 MED ORDER — MOMETASONE FUROATE 50 MCG/ACT NA SUSP: NASAL | 0 refills | Status: AC

## 2024-12-17 MED ORDER — ALBUTEROL SULFATE HFA 108 (90 BASE) MCG/ACT IN AERS: 2.0000 | INHALATION_SPRAY | Freq: Four times a day (QID) | RESPIRATORY_TRACT | 0 refills | Status: AC | PRN

## 2024-12-17 NOTE — Assessment & Plan Note (Signed)
 History of Present Illness Toni Vang is a 59 year old female with chronic rhinosinusitis and prior sinus surgery who presents with one month of persistent cough.  Cough and upper airway symptoms - Persistent cough for one month, onset following sinus infection diagnosis - Initial treatment with azithromycin  without improvement - Worsening symptoms after Christmas, including severe cough and malaise - Cough persists despite amoxicillin -clavulanate and prednisone  - Nocturnal symptoms include persistent coughing, snoring, and vocalizations as observed by her husband - Chest tightness with deep inspiration, especially during coughing fits - No shortness of breath at rest except during coughing episodes - No chest pain unrelated to coughing - Albuterol  inhaler has not provided relief - Requires pseudoephedrine every 4-8 hours to control symptoms and prevent coughing - Uses Zyrtec daily and Mucinex for secretion thinning - Has tried NyQuil and other over-the-counter cough and cold medications for symptomatic relief - Previously used promethazine  with good effect but currently out  Nasal congestion and postnasal drip - Ongoing nasal congestion and postnasal drip with white mucus - Facial swelling lasting approximately one week during symptom exacerbation - No new exposures or changes in home environment  Cervical lymphadenopathy and oropharyngeal irritation - Significant left-sided cervical lymphadenopathy noted during symptom exacerbation - Oropharyngeal irritation present  Musculoskeletal symptoms - Axillary myalgias, particularly at the end of the day  Systemic symptoms - No fevers, night sweats, or unintentional weight loss  Sinus surgery history - 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 Oropharynx with erythema, no exudate, no cobblestone pattern. Nasal pharynx with bilateral turbinate erythema and mild swelling. Sinuses non-tender. NECK: Left posterior and right anterior cervical chain lymphadenopathy. CHEST: Lungs clear to auscultation bilaterally, no wheezes, rales, rhonchi.  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 No lower airway involvement. Gradual improvement expected with targeted therapy. - Prescribed intranasal mometasone  for nasal mucosal inflammation. - Recommended switching to alternative second-generation antihistamine. - Advised continued use of guaifenesin 1200 mg extended-release to thin mucus. - Emphasized increased oral hydration to enhance guaifenesin efficacy. - Recommended saline nasal spray for symptomatic relief and mucus clearance. - Prescribed Nyquil for nocturnal cough. - Advised use of a humidifier at night for airway moisture. - Prescribed albuterol  inhaler as standby. - Instructed to continue regimen for at least 7 days, repeat if symptoms recur. - Advised follow-up if symptoms do not improve after 7 days or worsen. - Discussed possible allergist referral if symptoms persist.  Cervical lymphadenopathy Cervical lymphadenopathy likely reactive to upper airway inflammation and postnasal drainage. No malignancy or systemic infection features. Resolution expected with sinonasal inflammation improvement.

## 2024-12-17 NOTE — Telephone Encounter (Signed)
Noted  Pt has an appt  KP 

## 2024-12-17 NOTE — Patient Instructions (Signed)
 VISIT SUMMARY:  You visited us  today due to a persistent cough and other related symptoms following a sinus infection. We discussed your chronic rhinosinusitis, nasal congestion, and cervical lymphadenopathy, and we have adjusted your treatment plan to help manage these issues.  YOUR PLAN:  CHRONIC RHINOSINUSITIS WITH POSTNASAL DRIP: Your chronic rhinosinusitis with postnasal drip is causing ongoing symptoms despite previous treatments. -Start using intranasal mometasone  to reduce nasal mucosal inflammation. -Switch to a different second-generation antihistamine as you may have developed tolerance to cetirizine. -Continue taking guaifenesin 1200 mg extended-release to thin mucus. -Increase your oral hydration to help the guaifenesin work better. -Use saline nasal spray to relieve symptoms and clear mucus. -Take Nyquil at night to help with your nocturnal cough. -Use a humidifier at night to keep your airways moist. -Keep your albuterol  inhaler on hand, though it may not provide much benefit. -Follow this regimen for at least 7 days and repeat if symptoms come back. -Schedule a follow-up if your symptoms do not improve after 7 days or if they get worse. -Consider seeing an allergist if your symptoms persist.  CERVICAL LYMPHADENOPATHY: The swelling in your neck is likely due to the inflammation and drainage from your sinuses. -This should improve as your sinus inflammation gets better.

## 2024-12-17 NOTE — Progress Notes (Signed)
 "    Primary Care / Sports Medicine Office Visit  Patient Information:  Patient ID: Toni Vang, female DOB: [birth year] Age: 59 y.o. MRN: [id number]   Toni Vang is a pleasant 59 y.o. female presenting with the following:  Chief Complaint  Patient presents with   Cough    Cough x 1 month. Patient has taken a course of abx and prednisone  but continues to have cough. She has also tested negative for covid and flu.    Vitals:   12/17/24 1628  BP: 108/60  Pulse: 88  SpO2: 97%   Vitals:   12/17/24 1628  Weight: 194 lb (88 kg)  Height: 5' 8 (1.727 m)   Body mass index is 29.5 kg/m.   Discussed the use of AI scribe software for clinical note transcription with the patient, who gave verbal consent to proceed.   Independent interpretation of notes and tests performed by another provider:   None  Procedures performed:   None  Pertinent History, Exam, Impression, and Recommendations:   Problem List Items Addressed This Visit     Postnasal drip - Primary   History of Present Illness Toni Vang is a 59 year old female with chronic rhinosinusitis and prior sinus surgery who presents with one month of persistent cough.  Cough and upper airway symptoms - Persistent cough for one month, onset following sinus infection diagnosis - Initial treatment with azithromycin  without improvement - Worsening symptoms after Christmas, including severe cough and malaise - Cough persists despite amoxicillin -clavulanate and prednisone  - Nocturnal symptoms include persistent coughing, snoring, and vocalizations as observed by her husband - Chest tightness with deep inspiration, especially during coughing fits - No shortness of breath at rest except during coughing episodes - No chest pain unrelated to coughing - Albuterol  inhaler has not provided relief - Requires pseudoephedrine every 4-8 hours to control symptoms and prevent coughing - Uses Zyrtec daily and Mucinex for secretion  thinning - Has tried NyQuil and other over-the-counter cough and cold medications for symptomatic relief - Previously used promethazine  with good effect but currently out  Nasal congestion and postnasal drip - Ongoing nasal congestion and postnasal drip with white mucus - Facial swelling lasting approximately one week during symptom exacerbation - No new exposures or changes in home environment  Cervical lymphadenopathy and oropharyngeal irritation - Significant left-sided cervical lymphadenopathy noted during symptom exacerbation - Oropharyngeal irritation present  Musculoskeletal symptoms - Axillary myalgias, particularly at the end of the day  Systemic symptoms - No fevers, night sweats, or unintentional weight loss  Sinus surgery history - 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 Oropharynx with erythema, no exudate, no cobblestone pattern. Nasal pharynx with bilateral turbinate erythema and mild swelling. Sinuses non-tender. NECK: Left posterior and right anterior cervical chain lymphadenopathy. CHEST: Lungs clear to auscultation bilaterally, no wheezes, rales, rhonchi.  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 No lower airway involvement. Gradual improvement expected with targeted therapy. - Prescribed intranasal mometasone  for nasal mucosal inflammation. - Recommended switching to alternative second-generation antihistamine. - Advised continued use of guaifenesin 1200 mg extended-release to thin mucus. - Emphasized increased oral hydration to enhance guaifenesin efficacy. - Recommended  saline nasal  spray for symptomatic relief and mucus clearance. - Prescribed Nyquil for nocturnal cough. - Advised use of a humidifier at night for airway moisture. - Prescribed albuterol  inhaler as standby. - Instructed to continue regimen for at least 7 days, repeat if symptoms recur. - Advised follow-up if symptoms do not improve after 7 days or worsen. - Discussed possible allergist referral if symptoms persist.  Cervical lymphadenopathy Cervical lymphadenopathy likely reactive to upper airway inflammation and postnasal drainage. No malignancy or systemic infection features. Resolution expected with sinonasal inflammation improvement.      Relevant Medications   mometasone  (NASONEX ) 50 MCG/ACT nasal spray   promethazine -dextromethorphan (PROMETHAZINE -DM) 6.25-15 MG/5ML syrup   Other Visit Diagnoses       Acute cough       Relevant Medications   albuterol  (VENTOLIN  HFA) 108 (90 Base) MCG/ACT inhaler        Orders & Medications Medications:  Meds ordered this encounter  Medications   mometasone  (NASONEX ) 50 MCG/ACT nasal spray    Sig: One spray in each nostril twice a day, use left hand for right nostril, and right hand for left nostril.  Please dispense one bottle.    Dispense:  1 g    Refill:  0   promethazine -dextromethorphan (PROMETHAZINE -DM) 6.25-15 MG/5ML syrup    Sig: Take 5 mLs by mouth 4 (four) times daily as needed for cough.    Dispense:  118 mL    Refill:  0   albuterol  (VENTOLIN  HFA) 108 (90 Base) MCG/ACT inhaler    Sig: Inhale 2 puffs into the lungs every 6 (six) hours as needed for shortness of breath (cough).    Dispense:  1 each    Refill:  0   No orders of the defined types were placed in this encounter.    No follow-ups on file.     Selinda JINNY Ku, MD, [facility]   Primary Care Sports Medicine Primary Care and Sports Medicine at [facility]   "

## 2024-12-17 NOTE — Telephone Encounter (Signed)
 FYI Only or Action Required?: FYI only for provider: appointment scheduled on 12/17/24.  Patient was last seen in primary care on 12/10/2024 by Toni Vang HERO, PA-C.  Called Nurse Triage reporting Cough, Nasal Congestion, and Shortness of Breath.  Symptoms began about a month ago.  Interventions attempted: OTC medications: Sudafed and Prescription medications: Prednsione, amoxicillin .  Symptoms are: gradually worsening.  Triage Disposition: See HCP Within 4 Hours (Or PCP Triage)  Patient/caregiver understands and will follow disposition?: Yes    Since 12/1 developed a sinus infection and 2 weeks later onset of cough with fatigue and some SOB. Has seen 2 providers in this timeframe. Symptoms have improved but concerned cough hasn't gone away completely. Mild cough today with thick white mucus and runny nose. No blood. Severe SOB with coughing. Mild SOB with exertion when not coughing. No SOB at rest. Sore under her armpits from coughing so much. No CP. Has hx of asthma, denies feeling like she's having an attack. Scheduled appt with different provider at home office today d/t no PCP availability within timeframe. Advised UC or ED for worsening symptoms.      Copied from CRM #[id number]. Topic: Clinical - Red Word Triage >> Dec 17, 2024  9:31 AM Ahlexyia S wrote: Red Word that prompted transfer to Nurse Triage: Pt called in stating that she has been having a worsening cough, extreme fatigue and a fever (pt didn't provide a temp when asked only mentioned that it was normal). Pt also mentioned that when she coughs she can't catch her breath, pt does have a inhaler but that hasn't been working. Pt denied any other symptoms. Pt is at work and unable to hold for NT, pt is requesting a callback. Reason for Disposition  [1] MILD difficulty breathing (e.g., minimal/no SOB at rest, SOB with walking, pulse < 100) AND [2] still present when not coughing  Answer Assessment - Initial Assessment  Questions 1. ONSET: When did the cough begin?      2 weeks ago  2. SEVERITY: How bad is the cough today?      Mild today  3. SPUTUM: Describe the color of your sputum (e.g., none, dry cough; clear, white, yellow, green)     Thick and white  4. HEMOPTYSIS: Are you coughing up any blood? If Yes, ask: How much? (e.g., flecks, streaks, tablespoons, etc.)     Denies  5. DIFFICULTY BREATHING: Are you having difficulty breathing? If Yes, ask: How bad is it? (e.g., mild, moderate, severe)      Severe with coughing. None at rest, mild with exertion  6. FEVER: Do you have a fever? If Yes, ask: What is your temperature, how was it measured, and when did it start?     Denies  7. CARDIAC HISTORY: Do you have any history of heart disease? (e.g., heart attack, congestive heart failure)      Denies  8. LUNG HISTORY: Do you have any history of lung disease?  (e.g., pulmonary embolus, asthma, emphysema)     Asthma  9. PE RISK FACTORS: Do you have a history of blood clots? (or: recent major surgery, recent prolonged travel, bedridden)     Denies  10. OTHER SYMPTOMS: Do you have any other symptoms? (e.g., runny nose, wheezing, chest pain)       Runny nose  Protocols used: Cough - Acute Productive-A-AH

## 2024-12-28 ENCOUNTER — Telehealth: Payer: Self-pay | Admitting: Pharmacy Technician

## 2024-12-28 ENCOUNTER — Other Ambulatory Visit (HOSPITAL_COMMUNITY): Payer: Self-pay

## 2024-12-28 NOTE — Telephone Encounter (Signed)
 Pharmacy Patient Advocate Encounter   Received notification from [facility] that prior authorization for Albuterol  108 (90 base) MCG/ACT is required/requested.   Insurance verification completed.   The patient is insured through [employer].   Per test claim: The current 30 day co-pay is, $4.  No PA needed at this time. This test claim was processed through [facility]- copay amounts may vary at other pharmacies due to pharmacy/plan contracts, or as the patient moves through the different stages of their insurance plan.    Called pharmacy to let them know to enter it for generic Ventolin .
